# Patient Record
Sex: Female | Born: 1980
Health system: Southern US, Community
[De-identification: ages and names within clinical notes are randomized; demographics above are authoritative.]

## PROBLEM LIST (undated history)

## (undated) DIAGNOSIS — R87820 Cervical low risk human papillomavirus (HPV) DNA test positive: Secondary | ICD-10-CM

## (undated) DIAGNOSIS — F419 Anxiety disorder, unspecified: Secondary | ICD-10-CM

## (undated) DIAGNOSIS — E785 Hyperlipidemia, unspecified: Secondary | ICD-10-CM

## (undated) DIAGNOSIS — E781 Pure hyperglyceridemia: Secondary | ICD-10-CM

## (undated) DIAGNOSIS — F329 Major depressive disorder, single episode, unspecified: Secondary | ICD-10-CM

## (undated) DIAGNOSIS — T7840XA Allergy, unspecified, initial encounter: Secondary | ICD-10-CM

## (undated) DIAGNOSIS — K859 Acute pancreatitis without necrosis or infection, unspecified: Secondary | ICD-10-CM

## (undated) DIAGNOSIS — F32A Depression, unspecified: Secondary | ICD-10-CM

## (undated) HISTORY — PX: TONSILLECTOMY: SUR1361

## (undated) HISTORY — DX: Cervical low risk human papillomavirus (HPV) DNA test positive: R87.820

## (undated) HISTORY — DX: Depression, unspecified: F32.A

## (undated) HISTORY — DX: Allergy, unspecified, initial encounter: T78.40XA

## (undated) HISTORY — DX: Hyperlipidemia, unspecified: E78.5

## (undated) HISTORY — DX: Acute pancreatitis without necrosis or infection, unspecified: K85.90

## (undated) HISTORY — PX: TUBAL LIGATION: SHX77

## (undated) HISTORY — DX: Pure hyperglyceridemia: E78.1

## (undated) HISTORY — DX: Anxiety disorder, unspecified: F41.9

## (undated) HISTORY — DX: Major depressive disorder, single episode, unspecified: F32.9

## (undated) HISTORY — PX: CHOLECYSTECTOMY: SHX55

---

## 2000-10-30 DIAGNOSIS — K859 Acute pancreatitis without necrosis or infection, unspecified: Secondary | ICD-10-CM

## 2000-10-30 HISTORY — DX: Acute pancreatitis without necrosis or infection, unspecified: K85.90

## 2015-05-21 HISTORY — PX: ESOPHAGOGASTRODUODENOSCOPY: SHX1529

## 2017-12-13 LAB — HM MAMMOGRAPHY

## 2018-04-29 ENCOUNTER — Ambulatory Visit (INDEPENDENT_AMBULATORY_CARE_PROVIDER_SITE_OTHER): Payer: No Typology Code available for payment source | Admitting: Physician Assistant

## 2018-04-29 ENCOUNTER — Encounter: Payer: Self-pay | Admitting: Physician Assistant

## 2018-04-29 VITALS — BP 116/81 | HR 66 | Resp 14 | Ht 59.0 in | Wt 145.0 lb

## 2018-04-29 DIAGNOSIS — E782 Mixed hyperlipidemia: Secondary | ICD-10-CM

## 2018-04-29 DIAGNOSIS — N920 Excessive and frequent menstruation with regular cycle: Secondary | ICD-10-CM | POA: Diagnosis not present

## 2018-04-29 DIAGNOSIS — E785 Hyperlipidemia, unspecified: Secondary | ICD-10-CM | POA: Diagnosis not present

## 2018-04-29 DIAGNOSIS — Z7689 Persons encountering health services in other specified circumstances: Secondary | ICD-10-CM

## 2018-04-29 DIAGNOSIS — I499 Cardiac arrhythmia, unspecified: Secondary | ICD-10-CM | POA: Insufficient documentation

## 2018-04-29 DIAGNOSIS — Z5181 Encounter for therapeutic drug level monitoring: Secondary | ICD-10-CM

## 2018-04-29 DIAGNOSIS — F418 Other specified anxiety disorders: Secondary | ICD-10-CM | POA: Diagnosis not present

## 2018-04-29 DIAGNOSIS — Z6829 Body mass index (BMI) 29.0-29.9, adult: Secondary | ICD-10-CM

## 2018-04-29 DIAGNOSIS — F419 Anxiety disorder, unspecified: Secondary | ICD-10-CM | POA: Insufficient documentation

## 2018-04-29 DIAGNOSIS — E781 Pure hyperglyceridemia: Secondary | ICD-10-CM

## 2018-04-29 HISTORY — DX: Pure hyperglyceridemia: E78.1

## 2018-04-29 MED ORDER — BUPROPION HCL 75 MG PO TABS: ORAL_TABLET | ORAL | 1 refills | Status: DC

## 2018-04-29 MED ORDER — KELNOR 1/50 1-50 MG-MCG PO TABS: 1.0000 | ORAL_TABLET | Freq: Every day | ORAL | 4 refills | Status: DC

## 2018-04-29 MED ORDER — ESCITALOPRAM OXALATE 10 MG PO TABS: 10.0000 mg | ORAL_TABLET | Freq: Every day | ORAL | 1 refills | Status: DC

## 2018-04-29 NOTE — Progress Notes (Signed)
HPI:                                                                Virginia Martinez is a 37 y.o. female who presents to [place]: [place] today to establish care  Current concerns: medication management  Hypertriglyceridemia: currently on Zetia 10 mg. Reports at one point TG's were over 1000 and she was subsequently started on medication. Reports strong family history of hyperlipidemia. She has a history of acute pancreatitis in 2002 when she was 4 months post-partum. Reports about a week ago she was having some upper abdominal pain worse with eating anything. She went on a clear liquid diet for 2 days and symptoms resolved.  Depression/Anxiety: currently taking Lexapro 10 mg without difficulty. Denies depressed mod or anhedonia. Symptoms have been well-controlled for over 1 year. She is interested in Wellbutrin to help with appetite control and weight loss. Denies symptoms of mania/hypomania. Denies suicidal thinking. Denies auditory/visual hallucinations.  Menorrhagia: currently taking OCP for heavy menstrual cycles. Menses are regular, approx every 28 days. No cramping or pelvic pain. She has a history of tubal ligation. Denies any weakness or fatigue. She does have intermittent palpitations and chest pain.  Depression screen [facility] 2/9 04/29/2018  Decreased Interest 0  Down, Depressed, Hopeless 0  PHQ - 2 Score 0  Altered sleeping 0  Tired, decreased energy 0  Change in appetite 3  Feeling bad or failure about yourself  0  Trouble concentrating 0  Moving slowly or fidgety/restless 0  Suicidal thoughts 0  PHQ-9 Score 3    No flowsheet data found.    Past Medical History:  Diagnosis Date  . Anxiety   . Depression   . Hyperlipidemia   . Pancreatitis 2002   Past Surgical History:  Procedure Laterality Date  . CESAREAN SECTION    . CHOLECYSTECTOMY    . TONSILLECTOMY    . TUBAL LIGATION     Social History   Tobacco Use  .  Smoking status: Never Smoker  . Smokeless tobacco: Never Used  Substance Use Topics  . Alcohol use: Not Currently   family history includes Brain cancer in her father; Breast cancer in her mother; Hypertension in her mother; Prostate cancer in her father; Skin cancer in her mother.    ROS: negative except as noted in the HPI  Medications: Current Outpatient Medications  Medication Sig Dispense Refill  . buPROPion (WELLBUTRIN) 75 MG tablet Take 1 tablet (75 mg total) by mouth every morning for 7 days, THEN 2 tablets (150 mg total) every morning for 23 days. 60 tablet 1  . escitalopram (LEXAPRO) 10 MG tablet Take 1 tablet (10 mg total) by mouth at bedtime. 90 tablet 1  . ezetimibe (ZETIA) 10 MG tablet TK 1 T PO QD  3  . KELNOR 1/50 1-50 MG-MCG tablet Take 1 tablet by mouth daily. 3 Package 4   No current facility-administered medications for this visit.    Allergies  Allergen Reactions  . Azithromycin     Rash       Objective:  BP 116/81   Pulse 66   Resp 14   Ht 4\' 11"  (1.499 m)   Wt 145 lb (65.8 kg)   LMP 04/09/2018 (  Exact Date)   BMI 29.29 kg/m  Gen:  alert, not ill-appearing, no distress, appropriate for age 27: head normocephalic without obvious abnormality, conjunctiva and cornea clear, trachea midline Pulm: Normal work of breathing, normal phonation Neuro: alert and oriented x 3, no tremor MSK: extremities atraumatic, normal gait and station Skin: intact, no rashes on exposed skin, no jaundice, no cyanosis Psych: well-groomed, cooperative, good eye contact, euthymic mood, affect mood-congruent, speech is articulate, and thought processes clear and goal-directed    Results for orders placed or performed in visit on 04/29/18 (from the past 72 hour(s))  CBC     Status: Abnormal   Collection Time: 04/29/18  2:53 PM  Result Value Ref Range   WBC 7.3 3.8 - 10.8 Thousand/uL   RBC 3.88 3.80 - 5.10 Million/uL   Hemoglobin 12.8 11.7 - 15.5 g/dL   HCT 35.3 35.0 -  45.0 %   MCV 91.0 80.0 - 100.0 fL   MCH 33.0 27.0 - 33.0 pg   MCHC 36.3 (H) 32.0 - 36.0 g/dL   RDW 12.8 11.0 - 15.0 %   Platelets 248 140 - 400 Thousand/uL   MPV 11.6 7.5 - 12.5 fL  Comprehensive metabolic panel     Status: Abnormal   Collection Time: 04/29/18  2:53 PM  Result Value Ref Range   Glucose, Bld 78 65 - 139 mg/dL    Comment: .        Non-fasting reference interval .    BUN 12 7 - 25 mg/dL   Creat 0.54 0.50 - 1.10 mg/dL   BUN/Creatinine Ratio NOT APPLICABLE 6 - 22 (calc)   Sodium 134 (L) 135 - 146 mmol/L   Potassium 4.3 3.5 - 5.3 mmol/L   Chloride 100 98 - 110 mmol/L   CO2 24 20 - 32 mmol/L   Calcium 9.1 8.6 - 10.2 mg/dL   Total Protein 7.3 6.1 - 8.1 g/dL   Albumin 4.3 3.6 - 5.1 g/dL   Globulin 3.0 1.9 - 3.7 g/dL (calc)   AG Ratio 1.4 1.0 - 2.5 (calc)   Total Bilirubin 0.3 0.2 - 1.2 mg/dL   Alkaline phosphatase (APISO) 52 33 - 115 U/L   AST 10 10 - 30 U/L   ALT 7 6 - 29 U/L  Lipid Panel w/reflex Direct LDL     Status: Abnormal   Collection Time: 04/29/18  2:53 PM  Result Value Ref Range   Cholesterol 428 (H) <200 mg/dL   HDL 42 (L) >50 mg/dL   Triglycerides 2,362 (H) <150 mg/dL    Comment: Verified by repeat analysis. Marland Kitchen    LDL Cholesterol (Calc)  mg/dL (calc)    Comment: . LDL cholesterol not calculated. Triglyceride levels greater than 400 mg/dL invalidate calculated LDL results. . Reference range: <100 . Desirable range <100 mg/dL for primary prevention;   <70 mg/dL for patients with CHD or diabetic patients  with > or = 2 CHD risk factors. Marland Kitchen LDL-C is now calculated using the Martin-Hopkins  calculation, which is a validated novel method providing  better accuracy than the Friedewald equation in the  estimation of LDL-C.  Cresenciano Genre et al. Annamaria Helling. 7616;073(71): 2061-2068  (http://education.[employer].com/faq/FAQ164)    Total CHOL/HDL Ratio 10.2 (H) <5.0 (calc)   Non-HDL Cholesterol (Calc) 386 (H) <130 mg/dL (calc)    Comment: Non-HDL level >  or = 220 is very high and may indicate  genetic familial hypercholesterolemia (FH). Clinical  assessment and measurement of blood lipid levels  should be considered for  all first-degree relatives  of patients with an FH diagnosis. . For patients with diabetes plus 1 major ASCVD risk  factor, treating to a non-HDL-C goal of <100 mg/dL  (LDL-C of <70 mg/dL) is considered a therapeutic  option.   SPECIMEN COMPROMISED     Status: None   Collection Time: 04/29/18  2:53 PM  Result Value Ref Range   Specimen Integrity      Comment: . Whole blood, unspun or partially spun gel barrier tube was received more than 6 hours since collection. A  false elevation of K, Phos and LD as well as a false  decrease in glucose may occur due to prolonged contact  with red cells. Marland Kitchen   DIRECT LDL     Status: None   Collection Time: 04/29/18  2:53 PM  Result Value Ref Range   Direct LDL CANCELED     Comment: Test not performed. Extremely high  Triglycerides values (>1293 mg/dL)  interfere with the dLDL assay.  Marland Kitchen Desirable range <100 mg/dL for primary prevention;   <70 mg/dL for patients with CHD or diabetic patients  with > or = 2 CHD risk factors. .  Result canceled by the ancillary.    No results found.    Assessment and Plan: 37 y.o. female with   Encounter to establish care  Menorrhagia with regular cycle - Plan: KELNOR 1/50 1-50 MG-MCG tablet  Mixed anxiety and depressive disorder - Plan: buPROPion (WELLBUTRIN) 75 MG tablet, escitalopram (LEXAPRO) 10 MG tablet  Dyslipidemia - Plan: Lipid Panel w/reflex Direct LDL  Hypertriglyceridemia - Plan: Lipid Panel w/reflex Direct LDL  Encounter for medication monitoring - Plan: CBC, Comprehensive metabolic panel  Adult BMI 29.0-29.9 kg/sq m   - Personally reviewed PMH, PSH, PFH, medications, allergies, HM - Age-appropriate cancer screening: Pap smear UTD per patient, requesting records from [facility], mammogram completed  12/13/17, reviewed in [place], negative - Influenza n/a - Tdap UTD per patient - PHQ2 negative  Menorrhagia - nonsmoker, no history of VTE, no migraines or HTN. Hx of tubal ligation and compliant with OCP, urine preg not needed. Refill of generic Kelnor sent to pharmacy  Mixed anxiety and depressive disorder PHQ2=0, PHQ9=3 In remission on Lexapro 10 mg. Amenable to adding on Wellbutrin for weight gain. Advised patient to monitor for worsening anxiety/irritability. Starting low-dose and will titrate to therapeutic dose based on tolerability  Hypertriglyceridemia - fasting lipid panel - holding Zetia refill pending labs. She may be a good candidate for Lovaza or Vascepa - mediterranean diet, regular aerobic exercise and weight loss  BMI 29 - starting wellbutrin 75 mg QAM - counseled on weight loss through decreased caloric intake and increased aerobic exericse  Patient education and anticipatory guidance given Patient agrees with treatment plan Follow-up as needed if symptoms worsen or fail to improve  Darlyne Russian PA-C

## 2018-04-29 NOTE — Patient Instructions (Signed)
Physical Activity Recommendations for modifying lipids and lowering blood pressure Engage in aerobic physical activity to reduce LDL-cholesterol, non-HDL-cholesterol, and blood pressure  Frequency: 3-4 sessions per week  Intensity: moderate to vigorous  Duration: 40 minutes on average  Physical Activity Recommendations for secondary prevention 1. Aerobic exercise  Frequency: 3-5 sessions per week  Intensity: 50-80% capacity  Duration: 20 - 60 minutes  Examples: walking, treadmill, cycling, rowing, stair climbing, and arm/leg ergometry  2. Resistance exercise  Frequency: 2-3 sessions per week  Intensity: 10-15 repetitions/set to moderate fatigue  Duration: 1-3 sets of 8-10 upper and lower body exercises  Examples: calisthenics, elastic bands, cuff/hand weights, dumbbels, free weights, wall pulleys, and weight machines  Heart-Healthy Lifestyle  Eating a diet rich in vegetables, fruits and whole grains: also includes low-fat dairy products, poultry, fish, legumes, and nuts; limit intake of sweets, sugar-sweetened beverages and red meats  Getting regular exercise  Maintaining a healthy weight  Not smoking or getting help quitting  Staying on top of your health; for some people, lifestyle changes alone may not be enough to prevent a heart attack or stroke. In these cases, taking a statin at the right dose will most likely be necessary  

## 2018-04-30 ENCOUNTER — Encounter: Payer: Self-pay | Admitting: Physician Assistant

## 2018-04-30 DIAGNOSIS — Z6829 Body mass index (BMI) 29.0-29.9, adult: Secondary | ICD-10-CM | POA: Insufficient documentation

## 2018-04-30 DIAGNOSIS — E782 Mixed hyperlipidemia: Secondary | ICD-10-CM | POA: Insufficient documentation

## 2018-04-30 LAB — SPECIMEN COMPROMISED

## 2018-04-30 LAB — LIPID PANEL W/REFLEX DIRECT LDL
Cholesterol: 428 mg/dL — ABNORMAL HIGH (ref <200)
HDL: 42 mg/dL — AB (ref >50)
NON-HDL CHOLESTEROL (CALC): 386 mg/dL — AB (ref <130)
TRIGLYCERIDES: 2362 mg/dL — AB (ref <150)
Total CHOL/HDL Ratio: 10.2 (calc) — ABNORMAL HIGH (ref <5.0)

## 2018-04-30 LAB — COMPREHENSIVE METABOLIC PANEL
AG Ratio: 1.4 (calc) (ref 1.0–2.5)
ALT: 7 U/L (ref 6–29)
AST: 10 U/L (ref 10–30)
Albumin: 4.3 g/dL (ref 3.6–5.1)
Alkaline phosphatase (APISO): 52 U/L (ref 33–115)
BUN: 12 mg/dL (ref 7–25)
CO2: 24 mmol/L (ref 20–32)
CREATININE: 0.54 mg/dL (ref 0.50–1.10)
Calcium: 9.1 mg/dL (ref 8.6–10.2)
Chloride: 100 mmol/L (ref 98–110)
Globulin: 3 g/dL (calc) (ref 1.9–3.7)
Glucose, Bld: 78 mg/dL (ref 65–139)
Potassium: 4.3 mmol/L (ref 3.5–5.3)
Sodium: 134 mmol/L — ABNORMAL LOW (ref 135–146)
Total Bilirubin: 0.3 mg/dL (ref 0.2–1.2)
Total Protein: 7.3 g/dL (ref 6.1–8.1)

## 2018-04-30 LAB — CBC
HEMATOCRIT: 35.3 % (ref 35.0–45.0)
Hemoglobin: 12.8 g/dL (ref 11.7–15.5)
MCH: 33 pg (ref 27.0–33.0)
MCHC: 36.3 g/dL — AB (ref 32.0–36.0)
MCV: 91 fL (ref 80.0–100.0)
MPV: 11.6 fL (ref 7.5–12.5)
Platelets: 248 10*3/uL (ref 140–400)
RBC: 3.88 10*6/uL (ref 3.80–5.10)
RDW: 12.8 % (ref 11.0–15.0)
WBC: 7.3 10*3/uL (ref 3.8–10.8)

## 2018-04-30 LAB — DIRECT LDL

## 2018-04-30 MED ORDER — ICOSAPENT ETHYL 1 G PO CAPS: 2.0000 g | ORAL_CAPSULE | Freq: Two times a day (BID) | ORAL | 3 refills | Status: DC

## 2018-04-30 MED ORDER — ATORVASTATIN CALCIUM 40 MG PO TABS: 40.0000 mg | ORAL_TABLET | Freq: Every day | ORAL | 3 refills | Status: DC

## 2018-04-30 NOTE — Addendum Note (Signed)
Addended by: Nelson Chimes E on: 04/30/2018 01:43 PM   Modules accepted: Orders

## 2018-04-30 NOTE — Progress Notes (Signed)
Hi Virginia Martinez,  Your triglycerides are definitely high enough to get Vascepa approved. Your total cholesterol is also very high at 428. So I will also start you on Atorvastatin 40 mg at bedtime.  Let's recheck fasting lipid panel in 6-8 weeks.  FYI, your Vascepa may require a prior authorization so give [place] 2 weeks to get this approved. Here is a link to the prescription savings card [email or web address]  Best, [employer]

## 2018-05-01 ENCOUNTER — Encounter: Payer: Self-pay | Admitting: Physician Assistant

## 2018-05-06 ENCOUNTER — Other Ambulatory Visit (HOSPITAL_COMMUNITY)
Admission: RE | Admit: 2018-05-06 | Discharge: 2018-05-06 | Disposition: A | Discharge status: Home / Self Care | Payer: No Typology Code available for payment source | Source: Ambulatory Visit | Attending: Physician Assistant | Admitting: Physician Assistant

## 2018-05-06 ENCOUNTER — Encounter: Payer: Self-pay | Admitting: Physician Assistant

## 2018-05-06 ENCOUNTER — Ambulatory Visit (INDEPENDENT_AMBULATORY_CARE_PROVIDER_SITE_OTHER): Payer: No Typology Code available for payment source | Admitting: Physician Assistant

## 2018-05-06 VITALS — BP 114/78 | HR 77 | Wt 142.0 lb

## 2018-05-06 DIAGNOSIS — Z124 Encounter for screening for malignant neoplasm of cervix: Secondary | ICD-10-CM

## 2018-05-06 DIAGNOSIS — Z808 Family history of malignant neoplasm of other organs or systems: Secondary | ICD-10-CM

## 2018-05-06 DIAGNOSIS — Z0000 Encounter for general adult medical examination without abnormal findings: Secondary | ICD-10-CM

## 2018-05-06 DIAGNOSIS — Z1283 Encounter for screening for malignant neoplasm of skin: Secondary | ICD-10-CM

## 2018-05-06 DIAGNOSIS — E782 Mixed hyperlipidemia: Secondary | ICD-10-CM | POA: Diagnosis not present

## 2018-05-06 DIAGNOSIS — B3731 Acute candidiasis of vulva and vagina: Secondary | ICD-10-CM

## 2018-05-06 DIAGNOSIS — B373 Candidiasis of vulva and vagina: Secondary | ICD-10-CM

## 2018-05-06 NOTE — Progress Notes (Signed)
HPI:                                                                Virginia Martinez is a 37 y.o. female who presents to [place]: [place] today for annual physical exam w/Pap smear  No concerns No history of abnormal Pap smears Denies breast tenderness, palpable masses or nipple discharge. She is currently undergoing early breast cancer screening due to fibrocystic disease. Denies abnormal uterine bleeding, vaginal discharge or dyspareunia History of tubal ligation Takes OCP for menorrhagia. Last Hgb 12.8, 1 week ago  Depression screen [facility] 2/9 04/29/2018  Decreased Interest 0  Down, Depressed, Hopeless 0  PHQ - 2 Score 0  Altered sleeping 0  Tired, decreased energy 0  Change in appetite 3  Feeling bad or failure about yourself  0  Trouble concentrating 0  Moving slowly or fidgety/restless 0  Suicidal thoughts 0  PHQ-9 Score 3    No flowsheet data found.    Past Medical History:  Diagnosis Date  . Anxiety   . Depression   . Hyperlipidemia   . Hypertriglyceridemia 04/29/2018  . Pancreatitis 2002   Past Surgical History:  Procedure Laterality Date  . CESAREAN SECTION    . CHOLECYSTECTOMY    . TONSILLECTOMY    . TUBAL LIGATION     Social History   Tobacco Use  . Smoking status: Never Smoker  . Smokeless tobacco: Never Used  Substance Use Topics  . Alcohol use: Not Currently   family history includes Brain cancer in her father; Breast cancer in her mother; Hypertension in her mother; Prostate cancer in her father; Skin cancer in her mother.    ROS: negative except as noted in the HPI  Medications: Current Outpatient Medications  Medication Sig Dispense Refill  . atorvastatin (LIPITOR) 40 MG tablet Take 1 tablet (40 mg total) by mouth at bedtime. 30 tablet 3  . buPROPion (WELLBUTRIN) 75 MG tablet Take 1 tablet (75 mg total) by mouth every morning for 7 days, THEN 2 tablets (150 mg total) every morning for 23 days.  60 tablet 1  . escitalopram (LEXAPRO) 10 MG tablet Take 1 tablet (10 mg total) by mouth at bedtime. 90 tablet 1  . ezetimibe (ZETIA) 10 MG tablet TK 1 T PO QD  3  . Icosapent Ethyl 1 g CAPS Take 2 capsules (2 g total) by mouth 2 (two) times daily with a meal. 120 capsule 3  . KELNOR 1/50 1-50 MG-MCG tablet Take 1 tablet by mouth daily. 3 Package 4   No current facility-administered medications for this visit.    Allergies  Allergen Reactions  . Azithromycin     Rash       Objective:  BP 114/78   Pulse 77   Wt 142 lb (64.4 kg)   LMP 04/09/2018 (Exact Date)   BMI 28.68 kg/m  General Appearance:  Alert, cooperative, no distress, appropriate for age                            Head:  Normocephalic, without obvious abnormality  Eyes:  PERRL, EOM's intact, conjunctiva and cornea clear                             Ears:  TM pearly gray color and semitransparent, external ear canals normal, both ears                            Nose:  Nares symmetrical                          Throat:  Lips, tongue, and mucosa are moist, pink, and intact; good dentition                             Neck:  Supple; symmetrical, trachea midline, no adenopathy; thyroid: no enlargement, symmetric, no tenderness/mass/nodules                             Back:  Symmetrical, no curvature, ROM normal               Chest/Breast:  Diffuse fibrocystic densities, No tenderness, or nipple abnormality. No dimpling or discharge                           Lungs:  Clear to auscultation bilaterally, respirations unlabored                             Heart:  regular rate & normal rhythm, S1 and S2 normal, no murmurs, rubs, or gallops                     Abdomen:  Soft, non-tender, no mass or organomegaly              Genitourinary:  vulva without rashes or lesions, normal introitus and urethral meatus, vaginal mucosa without erythema, normal discharge, cervix non-friable without lesions          Musculoskeletal:  Tone and strength strong and symmetrical, all extremities; no joint pain or edema, normal gait and station                                     Lymphatic:  No adenopathy             Skin/Hair/Nails:  Skin warm, dry and intact, no rashes or abnormal dyspigmentation on limited exam                   Neurologic:  Alert and oriented x3, no cranial nerve deficits, DTR's intact, sensation grossly intact, normal gait and station, no tremor Psych: well-groomed, cooperative, good eye contact, euthymic mood, affect mood-congruent, speech is articulate, and thought processes clear and goal-directed  A chaperone was used for the GU portion of the exam, Virginia Martinez, CMA.   No results found for this or any previous visit (from the past 72 hour(s)). No results found.    Assessment and Plan: 37 y.o. female with   Encounter for annual physical exam  Encounter for Pap smear of cervix with HPV DNA cotesting - Plan: Cytology - PAP  Mixed hypercholesterolemia and hypertriglyceridemia - Plan: Lipid Panel w/reflex Direct LDL   -  started on Vascepa and Atorvastatin last week - counseled on fat and cholesterol restricted Mediterranean diet - BP at goal <130/80 - counseled on regular aerobic exercise  Patient education and anticipatory guidance given Patient agrees with treatment plan Follow-up in 6 weeks for fasting lipids as needed if symptoms worsen or fail to improve  Darlyne Russian PA-C

## 2018-05-06 NOTE — Addendum Note (Signed)
Addended by: Nelson Chimes E on: 05/06/2018 11:02 AM   Modules accepted: Orders

## 2018-05-06 NOTE — Patient Instructions (Signed)
Fat and Cholesterol Restricted Diet High levels of fat and cholesterol in your blood may lead to various health problems, such as diseases of the heart, blood vessels, gallbladder, liver, and pancreas. Fats are concentrated sources of energy that come in various forms. Certain types of fat, including saturated fat, may be harmful in excess. Cholesterol is a substance needed by your body in small amounts. Your body makes all the cholesterol it needs. Excess cholesterol comes from the food you eat. When you have high levels of cholesterol and saturated fat in your blood, health problems can develop because the excess fat and cholesterol will gather along the walls of your blood vessels, causing them to narrow. Choosing the right foods will help you control your intake of fat and cholesterol. This will help keep the levels of these substances in your blood within normal limits and reduce your risk of disease. What is my plan? Your health care provider recommends that you:  Limit your fat intake to ______% or less of your total calories per day.  Limit the amount of cholesterol in your diet to less than _________mg per day.  Eat 20-30 grams of fiber each day.  What types of fat should I choose?  Choose healthy fats more often. Choose monounsaturated and polyunsaturated fats, such as olive and canola oil, flaxseeds, walnuts, almonds, and seeds.  Eat more omega-3 fats. Good choices include salmon, mackerel, sardines, tuna, flaxseed oil, and ground flaxseeds. Aim to eat fish at least two times a week.  Limit saturated fats. Saturated fats are primarily found in animal products, such as meats, butter, and cream. Plant sources of saturated fats include palm oil, palm kernel oil, and coconut oil.  Avoid foods with partially hydrogenated oils in them. These contain trans fats. Examples of foods that contain trans fats are stick margarine, some tub margarines, cookies, crackers, and other baked goods. What  general guidelines do I need to follow? These guidelines for healthy eating will help you control your intake of fat and cholesterol:  Check food labels carefully to identify foods with trans fats or high amounts of saturated fat.  Fill one half of your plate with vegetables and green salads.  Fill one fourth of your plate with whole grains. Look for the word "whole" as the first word in the ingredient list.  Fill one fourth of your plate with lean protein foods.  Limit fruit to two servings a day. Choose fruit instead of juice.  Eat more foods that contain fiber, such as apples, broccoli, carrots, beans, peas, and barley.  Eat more home-cooked food and less restaurant, buffet, and fast food.  Limit or avoid alcohol.  Limit foods high in starch and sugar.  Limit fried foods.  Cook foods using methods other than frying. Baking, boiling, grilling, and broiling are all great options.  Lose weight if you are overweight. Losing just 5-10% of your initial body weight can help your overall health and prevent diseases such as diabetes and heart disease.  What foods can I eat? Grains  Whole grains, such as whole wheat or whole grain breads, crackers, cereals, and pasta. Unsweetened oatmeal, bulgur, barley, quinoa, or brown rice. Corn or whole wheat flour tortillas. Vegetables  Fresh or frozen vegetables (raw, steamed, roasted, or grilled). Green salads. Fruits  All fresh, canned (in natural juice), or frozen fruits. Meats and other protein foods  Ground beef (85% or leaner), grass-fed beef, or beef trimmed of fat. Skinless chicken or turkey. Ground chicken or turkey.   Pork trimmed of fat. All fish and seafood. Eggs. Dried beans, peas, or lentils. Unsalted nuts or seeds. Unsalted canned or dry beans. Dairy  Low-fat dairy products, such as skim or 1% milk, 2% or reduced-fat cheeses, low-fat ricotta or cottage cheese, or plain low-fat yo Fats and oils  Tub margarines without trans  fats. Light or reduced-fat mayonnaise and salad dressings. Avocado. Olive, canola, sesame, or safflower oils. Natural peanut or almond butter (choose ones without added sugar and oil). The items listed above may not be a complete list of recommended foods or beverages. Contact your dietitian for more options. Foods to avoid Grains  White bread. White pasta. White rice. Cornbread. Bagels, pastries, and croissants. Crackers that contain trans fat. Vegetables  White potatoes. Corn. Creamed or fried vegetables. Vegetables in a cheese sauce. Fruits  Dried fruits. Canned fruit in light or heavy syrup. Fruit juice. Meats and other protein foods  Fatty cuts of meat. Ribs, chicken wings, bacon, sausage, bologna, salami, chitterlings, fatback, hot dogs, bratwurst, and packaged luncheon meats. Liver and organ meats. Dairy  Whole or 2% milk, cream, half-and-half, and cream cheese. Whole milk cheeses. Whole-fat or sweetened yogurt. Full-fat cheeses. Nondairy creamers and whipped toppings. Processed cheese, cheese spreads, or cheese curds. Beverages  Alcohol. Sweetened drinks (such as sodas, lemonade, and fruit drinks or punches). Fats and oils  Butter, stick margarine, lard, shortening, ghee, or bacon fat. Coconut, palm kernel, or palm oils. Sweets and desserts  Corn syrup, sugars, honey, and molasses. Candy. Jam and jelly. Syrup. Sweetened cereals. Cookies, pies, cakes, donuts, muffins, and ice cream. The items listed above may not be a complete list of foods and beverages to avoid. Contact your dietitian for more information. This information is not intended to replace advice given to you by your health care provider. Make sure you discuss any questions you have with your health care provider. Document Released: 10/16/2005 Document Revised: 11/06/2014 Document Reviewed: 01/14/2014 Elsevier Interactive Patient Education  2018 [employer]  

## 2018-05-08 ENCOUNTER — Encounter: Payer: Self-pay | Admitting: Physician Assistant

## 2018-05-08 DIAGNOSIS — R87820 Cervical low risk human papillomavirus (HPV) DNA test positive: Secondary | ICD-10-CM

## 2018-05-08 HISTORY — DX: Cervical low risk human papillomavirus (HPV) DNA test positive: R87.820

## 2018-05-08 LAB — CYTOLOGY - PAP
Adequacy: ABSENT
DIAGNOSIS: NEGATIVE
HPV 16/18/45 GENOTYPING: NEGATIVE
HPV: DETECTED — AB

## 2018-05-08 NOTE — Progress Notes (Signed)
Pap smear did not show any cell abnormalities HPV testing was positive, but the high risk strain testing was negative Recommend we repeat your Pap smear in 1 year

## 2018-05-09 MED ORDER — FLUCONAZOLE 150 MG PO TABS: 150.0000 mg | ORAL_TABLET | Freq: Once | ORAL | 0 refills | Status: AC

## 2018-05-09 NOTE — Addendum Note (Signed)
Addended by: Nelson Chimes E on: 05/09/2018 04:50 PM   Modules accepted: Orders

## 2018-06-19 ENCOUNTER — Other Ambulatory Visit
Admission: RE | Admit: 2018-06-19 | Discharge: 2018-06-19 | Disposition: A | Discharge status: Home / Self Care | Payer: No Typology Code available for payment source | Source: Ambulatory Visit | Attending: Physician Assistant | Admitting: Physician Assistant

## 2018-06-19 DIAGNOSIS — E785 Hyperlipidemia, unspecified: Secondary | ICD-10-CM | POA: Insufficient documentation

## 2018-06-19 DIAGNOSIS — E781 Pure hyperglyceridemia: Secondary | ICD-10-CM | POA: Insufficient documentation

## 2018-06-19 DIAGNOSIS — E782 Mixed hyperlipidemia: Secondary | ICD-10-CM

## 2018-06-19 LAB — LIPID PANEL
Cholesterol: 171 mg/dL (ref 0–200)
HDL: 50 mg/dL (ref >40)
LDL CALC: 75 mg/dL (ref 0–99)
TRIGLYCERIDES: 230 mg/dL — AB (ref <150)
Total CHOL/HDL Ratio: 3.4 RATIO
VLDL: 46 mg/dL — ABNORMAL HIGH (ref 0–40)

## 2018-07-02 ENCOUNTER — Other Ambulatory Visit: Payer: Self-pay | Admitting: Physician Assistant

## 2018-07-02 DIAGNOSIS — F418 Other specified anxiety disorders: Secondary | ICD-10-CM

## 2018-07-30 ENCOUNTER — Encounter: Payer: Self-pay | Admitting: Physician Assistant

## 2018-09-03 ENCOUNTER — Other Ambulatory Visit: Payer: Self-pay | Admitting: Physician Assistant

## 2018-09-03 DIAGNOSIS — E782 Mixed hyperlipidemia: Secondary | ICD-10-CM

## 2018-09-30 ENCOUNTER — Other Ambulatory Visit: Payer: Self-pay | Admitting: Physician Assistant

## 2018-09-30 DIAGNOSIS — F418 Other specified anxiety disorders: Secondary | ICD-10-CM

## 2018-11-19 ENCOUNTER — Other Ambulatory Visit: Payer: Self-pay | Admitting: Physician Assistant

## 2018-11-19 DIAGNOSIS — F418 Other specified anxiety disorders: Secondary | ICD-10-CM

## 2018-12-06 DIAGNOSIS — N8000 Endometriosis of the uterus, unspecified: Secondary | ICD-10-CM | POA: Insufficient documentation

## 2018-12-06 LAB — HM PAP SMEAR: HM Pap smear: NEGATIVE

## 2018-12-19 ENCOUNTER — Other Ambulatory Visit: Payer: Self-pay | Admitting: Physician Assistant

## 2018-12-19 DIAGNOSIS — F418 Other specified anxiety disorders: Secondary | ICD-10-CM

## 2018-12-23 ENCOUNTER — Other Ambulatory Visit: Payer: Self-pay

## 2018-12-23 DIAGNOSIS — F418 Other specified anxiety disorders: Secondary | ICD-10-CM

## 2018-12-23 MED ORDER — ESCITALOPRAM OXALATE 10 MG PO TABS: 10.0000 mg | ORAL_TABLET | Freq: Every day | ORAL | 0 refills | Status: DC

## 2018-12-23 NOTE — Telephone Encounter (Signed)
Patient scheduled a follow-up appointment.

## 2019-01-06 ENCOUNTER — Ambulatory Visit: Payer: No Typology Code available for payment source | Admitting: Physician Assistant

## 2019-01-16 ENCOUNTER — Encounter: Payer: Self-pay | Admitting: Physician Assistant

## 2019-01-20 ENCOUNTER — Encounter: Payer: Self-pay | Admitting: Physician Assistant

## 2019-01-20 ENCOUNTER — Ambulatory Visit (INDEPENDENT_AMBULATORY_CARE_PROVIDER_SITE_OTHER): Payer: No Typology Code available for payment source | Admitting: Physician Assistant

## 2019-01-20 ENCOUNTER — Other Ambulatory Visit: Payer: Self-pay

## 2019-01-20 VITALS — BP 106/73 | HR 60 | Wt 134.0 lb

## 2019-01-20 DIAGNOSIS — R635 Abnormal weight gain: Secondary | ICD-10-CM

## 2019-01-20 DIAGNOSIS — D500 Iron deficiency anemia secondary to blood loss (chronic): Secondary | ICD-10-CM | POA: Insufficient documentation

## 2019-01-20 DIAGNOSIS — F419 Anxiety disorder, unspecified: Secondary | ICD-10-CM

## 2019-01-20 DIAGNOSIS — Z1231 Encounter for screening mammogram for malignant neoplasm of breast: Secondary | ICD-10-CM

## 2019-01-20 DIAGNOSIS — E782 Mixed hyperlipidemia: Secondary | ICD-10-CM

## 2019-01-20 DIAGNOSIS — Z5181 Encounter for therapeutic drug level monitoring: Secondary | ICD-10-CM | POA: Diagnosis not present

## 2019-01-20 DIAGNOSIS — F418 Other specified anxiety disorders: Secondary | ICD-10-CM

## 2019-01-20 MED ORDER — ESCITALOPRAM OXALATE 10 MG PO TABS: 10.0000 mg | ORAL_TABLET | Freq: Every day | ORAL | 3 refills | Status: DC

## 2019-01-20 MED ORDER — BUPROPION HCL 100 MG PO TABS: 100.0000 mg | ORAL_TABLET | Freq: Every day | ORAL | 1 refills | Status: DC

## 2019-01-20 MED ORDER — ATORVASTATIN CALCIUM 40 MG PO TABS: 40.0000 mg | ORAL_TABLET | Freq: Every day | ORAL | 3 refills | Status: DC

## 2019-01-20 MED ORDER — ICOSAPENT ETHYL 1 G PO CAPS: 2.0000 g | ORAL_CAPSULE | Freq: Two times a day (BID) | ORAL | 3 refills | Status: DC

## 2019-01-20 NOTE — Progress Notes (Signed)
HPI:                                                                Virginia Martinez is a 38 y.o. female who presents to [place]: [place] today for medication management  Hypertriglyceridemia/HLD: currently on Vascepa and Atorvastatin. Reports strong family history of hyperlipidemia. She has a history of acute pancreatitis in 2002 when she was 4 months post-partum.   Depression/Anxiety: currently taking Lexapro 10 mg without difficulty. Denies depressed mod or anhedonia. Symptoms have been well-controlled for over 1 year. She is interested in Wellbutrin to help with appetite control and weight loss. Denies symptoms of mania/hypomania. Denies suicidal thinking. Denies auditory/visual hallucinations.  Weight gain: feels like the Wellbutrin is no longer helping with appetite control. Reports losing approx 10 pounds and gaining back about 5 lb.   Menorrhagia: planning to have IUD inserted in 1 week. CBC showed mild anemia (Hgb 11.9). Pap smear was repeated 11/2018 and negative.  Depression screen [facility] 2/9 01/20/2019 04/29/2018  Decreased Interest 0 0  Down, Depressed, Hopeless 0 0  PHQ - 2 Score 0 0  Altered sleeping 0 0  Tired, decreased energy 1 0  Change in appetite 0 3  Feeling bad or failure about yourself  0 0  Trouble concentrating 0 0  Moving slowly or fidgety/restless 0 0  Suicidal thoughts 0 0  PHQ-9 Score 1 3   No flowsheet data found.    Past Medical History:  Diagnosis Date  . Anxiety   . Cervical low risk human papillomavirus (HPV) DNA test positive 05/08/2018  . Depression   . Hyperlipidemia   . Hypertriglyceridemia 04/29/2018  . Pancreatitis 2002   Past Surgical History:  Procedure Laterality Date  . CESAREAN SECTION    . CHOLECYSTECTOMY    . TONSILLECTOMY    . TUBAL LIGATION     Social History   Tobacco Use  . Smoking status: Never Smoker  . Smokeless tobacco: Never Used  Substance Use Topics  . Alcohol use:  Not Currently   family history includes Brain cancer in her father; Breast cancer in her mother; Hypertension in her mother; Prostate cancer in her father; Skin cancer in her mother.    ROS: negative except as noted in the HPI  Medications: Current Outpatient Medications  Medication Sig Dispense Refill  . atorvastatin (LIPITOR) 40 MG tablet Take 1 tablet (40 mg total) by mouth at bedtime. 90 tablet 3  . buPROPion (WELLBUTRIN) 100 MG tablet Take 1 tablet (100 mg total) by mouth daily. 90 tablet 1  . Icosapent Ethyl 1 g CAPS Take 2 capsules (2 g total) by mouth 2 (two) times daily with a meal. 360 capsule 3  . KELNOR 1/50 1-50 MG-MCG tablet Take 1 tablet by mouth daily. 3 Package 4  . Lactobacillus (PROBIOTIC ACIDOPHILUS PO) Take by mouth.    . escitalopram (LEXAPRO) 10 MG tablet Take 1 tablet (10 mg total) by mouth at bedtime. 90 tablet 3   No current facility-administered medications for this visit.    Allergies  Allergen Reactions  . Azithromycin Hives    Rash       Objective:  BP 106/73   Pulse 60   Wt 134 lb (60.8 kg)  BMI 27.06 kg/m  Gen:  alert, not ill-appearing, no distress, appropriate for age 60: head normocephalic without obvious abnormality, conjunctiva and cornea clear, trachea midline Pulm: Normal work of breathing, normal phonation  Neuro: alert and oriented x 3, no tremor MSK: extremities atraumatic, normal gait and station Skin: intact, no rashes on exposed skin, no jaundice, no cyanosis Psych: well-groomed, cooperative, good eye contact, euthymic mood, affect mood-congruent, speech is articulate, and thought processes clear and goal-directed  Wt Readings from Last 3 Encounters:  01/20/19 134 lb (60.8 kg)  05/06/18 142 lb (64.4 kg)  04/29/18 145 lb (65.8 kg)    Lab Results  Component Value Date   CREATININE 0.54 04/29/2018   BUN 12 04/29/2018   NA 134 (L) 04/29/2018   K 4.3 04/29/2018   CL 100 04/29/2018   CO2 24 04/29/2018   Lab Results   Component Value Date   ALT 7 04/29/2018   AST 10 04/29/2018   BILITOT 0.3 04/29/2018   Lab Results  Component Value Date   CHOL 171 06/19/2018   HDL 50 06/19/2018   LDLCALC 75 06/19/2018   LDLDIRECT CANCELED 04/29/2018   TRIG 230 (H) 06/19/2018   CHOLHDL 3.4 06/19/2018       Assessment and Plan: 38 y.o. female with   .Rickell was seen today for medication management.  Diagnoses and all orders for this visit:  Encounter for medication monitoring  Mixed hypercholesterolemia and hypertriglyceridemia -     atorvastatin (LIPITOR) 40 MG tablet; Take 1 tablet (40 mg total) by mouth at bedtime. -     Icosapent Ethyl 1 g CAPS; Take 2 capsules (2 g total) by mouth 2 (two) times daily with a meal.  Anxiety  Mixed anxiety and depressive disorder -     escitalopram (LEXAPRO) 10 MG tablet; Take 1 tablet (10 mg total) by mouth at bedtime. -     buPROPion (WELLBUTRIN) 100 MG tablet; Take 1 tablet (100 mg total) by mouth daily.  Iron deficiency anemia due to chronic blood loss  Breast cancer screening by mammogram -     MM 3D SCREEN BREAST BILATERAL; Future  Abnormal weight gain -     buPROPion (WELLBUTRIN) 100 MG tablet; Take 1 tablet (100 mg total) by mouth daily.  Weight gain - overall down 11 pounds - increasing wellbutrin from 75 mg to 100 mg - recommended lifestyle changes and [employer] app   Patient education and anticipatory guidance given Patient agrees with treatment plan Follow-up in 4 months for fasting CPE or sooner as needed if symptoms worsen or fail to improve  Darlyne Russian PA-C

## 2019-01-20 NOTE — Patient Instructions (Signed)
Iron Deficiency Anemia, Adult  Iron deficiency anemia is a condition in which the concentration of red blood cells or hemoglobin in the blood is below normal because of too little iron. Hemoglobin is a substance in red blood cells that carries oxygen to the body's tissues. When the concentration of red blood cells or hemoglobin is too low, not enough oxygen reaches these tissues.  Iron deficiency anemia is usually long-lasting (chronic) and it develops over time. It may or may not cause symptoms. It is a common type of anemia.  What are the causes?  This condition may be caused by:   Not enough iron in the diet.   Blood loss caused by bleeding in the intestine.   Blood loss from a gastrointestinal condition like Crohn disease.   Frequent blood draws, such as from blood donation.   Abnormal absorption in the gut.   Heavy menstrual periods in women.   Cancers of the gastrointestinal system, such as colon cancer.  What are the signs or symptoms?  Symptoms of this condition may include:   Fatigue.   Headache.   Pale skin, lips, and nail beds.   Poor appetite.   Weakness.   Shortness of breath.   Dizziness.   Cold hands and feet.   Fast or irregular heartbeat.   Irritability. This is more common in severe anemia.   Rapid breathing. This is more common in severe anemia.  Mild anemia may not cause any symptoms.  How is this diagnosed?  This condition is diagnosed based on:   Your medical history.   A physical exam.   Blood tests.  You may have additional tests to find the underlying cause of your anemia, such as:   Testing for blood in the stool (fecal occult blood test).   A procedure to see inside your colon and rectum (colonoscopy).   A procedure to see inside your esophagus and stomach (endoscopy).   A test in which cells are removed from bone marrow (bone marrow aspiration) or fluid is removed from the bone marrow to be examined (biopsy). This is rarely needed.  How is this treated?  This  condition is treated by correcting the cause of your iron deficiency. Treatment may involve:   Adding iron-rich foods to your diet.   Taking iron supplements. If you are pregnant or breastfeeding, you may need to take extra iron because your normal diet usually does not provide the amount of iron that you need.   Increasing vitamin C intake. Vitamin C helps your body absorb iron. Your health care provider may recommend that you take iron supplements along with a glass of orange juice or a vitamin C supplement.   Medicines to make heavy menstrual flow lighter.   Surgery.  You may need repeat blood tests to determine whether treatment is working. Depending on the underlying cause, the anemia should be corrected within 2 months of starting treatment. If the treatment does not seem to be working, you may need more testing.  Follow these instructions at home:  Medicines   Take over-the-counter and prescription medicines only as told by your health care provider. This includes iron supplements and vitamins.   If you cannot tolerate taking iron supplements by mouth, talk with your health care provider about taking them through a vein (intravenously) or an injection into a muscle.   For the best iron absorption, you should take iron supplements when your stomach is empty. If you cannot tolerate them on an empty stomach,   you may need to take them with food.   Do not drink milk or take antacids at the same time as your iron supplements. Milk and antacids may interfere with iron absorption.   Iron supplements can cause constipation. To prevent constipation, include fiber in your diet as told by your health care provider. A stool softener may also be recommended.  Eating and drinking     Talk with your health care provider before changing your diet. He or she may recommend that you eat foods that contain a lot of iron, such as:  ? Liver.  ? Low-fat (lean) beef.  ? Breads and cereals that have iron added to them (are  fortified).  ? Eggs.  ? Dried fruit.  ? Dark green, leafy vegetables.   To help your body use the iron from iron-rich foods, eat those foods at the same time as fresh fruits and vegetables that are high in vitamin C. Foods that are high in vitamin C include:  ? Oranges.  ? Peppers.  ? Tomatoes.  ? Mangoes.   Drinkenoughfluid to keep your urine clear or pale yellow.  General instructions   Return to your normal activities as told by your health care provider. Ask your health care provider what activities are safe for you.   Practice good hygiene. Anemia can make you more prone to illness and infection.   Keep all follow-up visits as told by your health care provider. This is important.  Contact a health care provider if:   You feel nauseous or you vomit.   You feel weak.   You have unexplained sweating.   You develop symptoms of constipation, such as:  ? Having fewer than three bowel movements a week.  ? Straining to have a bowel movement.  ? Having stools that are hard, dry, or larger than normal.  ? Feeling full or bloated.  ? Pain in the lower abdomen.  ? Not feeling relief after having a bowel movement.  Get help right away if:   You faint. If this happens, do not drive yourself to the hospital. Call your local emergency services (911 in the [place]   You have chest pain.   You have shortness of breath that:  ? Is severe.  ? Gets worse with physical activity.   You have a rapid heartbeat.   You become light-headed when getting up from a sitting or lying down position.  This information is not intended to replace advice given to you by your health care provider. Make sure you discuss any questions you have with your health care provider.  Document Released: 10/13/2000 Document Revised: 07/05/2016 Document Reviewed: 07/05/2016  Elsevier Interactive Patient Education  2019 [employer]

## 2019-01-21 MED ORDER — BUPROPION HCL ER (SR) 100 MG PO TB12: 100.0000 mg | ORAL_TABLET | Freq: Every day | ORAL | 1 refills | Status: DC

## 2019-01-24 ENCOUNTER — Encounter: Payer: Self-pay | Admitting: Physician Assistant

## 2019-01-24 NOTE — Progress Notes (Unsigned)
Negative for intraepithelial lesion or malignancy.  

## 2019-02-08 ENCOUNTER — Encounter: Payer: Self-pay | Admitting: Physician Assistant

## 2019-03-31 ENCOUNTER — Encounter: Payer: Self-pay | Admitting: Physician Assistant

## 2019-09-08 ENCOUNTER — Encounter: Payer: No Typology Code available for payment source | Admitting: Osteopathic Medicine

## 2019-11-03 ENCOUNTER — Encounter: Payer: No Typology Code available for payment source | Admitting: Osteopathic Medicine

## 2019-12-11 MED FILL — ATORVASTATIN 40 MG TABLET: 40 | 90 days supply | Qty: 90 | Fill #0

## 2020-01-28 ENCOUNTER — Other Ambulatory Visit: Payer: Self-pay | Admitting: Physician Assistant

## 2020-01-28 DIAGNOSIS — F418 Other specified anxiety disorders: Secondary | ICD-10-CM

## 2020-02-23 ENCOUNTER — Other Ambulatory Visit: Payer: Self-pay

## 2020-02-23 ENCOUNTER — Ambulatory Visit (INDEPENDENT_AMBULATORY_CARE_PROVIDER_SITE_OTHER): Payer: No Typology Code available for payment source | Admitting: Osteopathic Medicine

## 2020-02-23 ENCOUNTER — Encounter: Payer: Self-pay | Admitting: Osteopathic Medicine

## 2020-02-23 ENCOUNTER — Encounter: Payer: Self-pay | Admitting: Gastroenterology

## 2020-02-23 VITALS — BP 107/72 | HR 61 | Temp 98.3°F | Wt 148.1 lb

## 2020-02-23 DIAGNOSIS — E782 Mixed hyperlipidemia: Secondary | ICD-10-CM

## 2020-02-23 DIAGNOSIS — F418 Other specified anxiety disorders: Secondary | ICD-10-CM | POA: Diagnosis not present

## 2020-02-23 DIAGNOSIS — N920 Excessive and frequent menstruation with regular cycle: Secondary | ICD-10-CM

## 2020-02-23 DIAGNOSIS — Z0000 Encounter for general adult medical examination without abnormal findings: Secondary | ICD-10-CM | POA: Diagnosis not present

## 2020-02-23 DIAGNOSIS — Z1231 Encounter for screening mammogram for malignant neoplasm of breast: Secondary | ICD-10-CM

## 2020-02-23 DIAGNOSIS — K219 Gastro-esophageal reflux disease without esophagitis: Secondary | ICD-10-CM

## 2020-02-23 DIAGNOSIS — R1013 Epigastric pain: Secondary | ICD-10-CM

## 2020-02-23 LAB — CBC
HCT: 42.5 % (ref 35.0–45.0)
Hemoglobin: 14.1 g/dL (ref 11.7–15.5)
MCH: 31.5 pg (ref 27.0–33.0)
MCHC: 33.2 g/dL (ref 32.0–36.0)
MCV: 95.1 fL (ref 80.0–100.0)
MPV: 11.1 fL (ref 7.5–12.5)
Platelets: 234 10*3/uL (ref 140–400)
RBC: 4.47 10*6/uL (ref 3.80–5.10)
RDW: 12.1 % (ref 11.0–15.0)
WBC: 5.5 10*3/uL (ref 3.8–10.8)

## 2020-02-23 LAB — COMPLETE METABOLIC PANEL WITH GFR
AG Ratio: 1.6 (calc) (ref 1.0–2.5)
ALT: 40 U/L — ABNORMAL HIGH (ref 6–29)
AST: 25 U/L (ref 10–30)
Albumin: 4.7 g/dL (ref 3.6–5.1)
Alkaline phosphatase (APISO): 66 U/L (ref 31–125)
BUN: 15 mg/dL (ref 7–25)
CO2: 29 mmol/L (ref 20–32)
Calcium: 9.5 mg/dL (ref 8.6–10.2)
Chloride: 103 mmol/L (ref 98–110)
Creat: 0.55 mg/dL (ref 0.50–1.10)
GFR, Est African American: 136 mL/min/{1.73_m2} (ref >=60)
GFR, Est Non African American: 117 mL/min/{1.73_m2} (ref >=60)
Globulin: 3 g/dL (calc) (ref 1.9–3.7)
Glucose, Bld: 91 mg/dL (ref 65–99)
Potassium: 4.4 mmol/L (ref 3.5–5.3)
Sodium: 138 mmol/L (ref 135–146)
Total Bilirubin: 0.8 mg/dL (ref 0.2–1.2)
Total Protein: 7.7 g/dL (ref 6.1–8.1)

## 2020-02-23 LAB — LIPID PANEL
Cholesterol: 200 mg/dL — ABNORMAL HIGH (ref <200)
HDL: 49 mg/dL — ABNORMAL LOW (ref >=50)
LDL Cholesterol (Calc): 126 mg/dL (calc) — ABNORMAL HIGH
Non-HDL Cholesterol (Calc): 151 mg/dL (calc) — ABNORMAL HIGH (ref <130)
Total CHOL/HDL Ratio: 4.1 (calc) (ref <5.0)
Triglycerides: 139 mg/dL (ref <150)

## 2020-02-23 MED ORDER — ESCITALOPRAM OXALATE 10 MG PO TABS: 10.0000 mg | ORAL_TABLET | Freq: Every day | ORAL | 3 refills | Status: AC

## 2020-02-23 MED ORDER — KELNOR 1/50 1-50 MG-MCG PO TABS: 1.0000 | ORAL_TABLET | Freq: Every day | ORAL | 4 refills | Status: AC

## 2020-02-23 MED ORDER — ICOSAPENT ETHYL 1 G PO CAPS: 2.0000 g | ORAL_CAPSULE | Freq: Two times a day (BID) | ORAL | 3 refills | Status: AC

## 2020-02-23 MED ORDER — BUPROPION HCL ER (SR) 100 MG PO TB12: 100.0000 mg | ORAL_TABLET | Freq: Every day | ORAL | 3 refills | Status: AC

## 2020-02-23 MED ORDER — ATORVASTATIN CALCIUM 40 MG PO TABS: 40.0000 mg | ORAL_TABLET | Freq: Every day | ORAL | 3 refills | Status: DC

## 2020-02-23 NOTE — Patient Instructions (Signed)
General Preventive Care  Most recent routine screening labs: ordered.   Everyone should have blood pressure checked once per year. Goal 130/80 or less.   Tobacco: don't!   Alcohol: responsible moderation is ok for most adults - if you have concerns about your alcohol intake, please talk to me!   Exercise: as tolerated to reduce risk of cardiovascular disease and diabetes. Strength training will also prevent osteoporosis.   Mental health: if need for mental health care (medicines, counseling, other), or concerns about moods, please let me know!   Sexual health: if need for STD testing, or if concerns with libido/pain problems, please let me know!   Advanced Directive: Living Will and/or Healthcare Power of Attorney recommended for all adults, regardless of age or health.  Vaccines  Flu vaccine: for almost everyone, every fall.   Shingles vaccine: after age 79.   Pneumonia vaccines: after age 58  Tetanus booster: every 10 years / 3rd trimester of pregnancy - due 2028  COVID vaccine: STRONGLY RECOMMENDED!  Cancer screenings   Colon cancer screening: for everyone age 22-75. Colonoscopy available for all, many people also qualify for the Cologuard stool test   Breast cancer screening: mammogram at age 64 every other year at least, and annually after age 25.   Cervical cancer screening: Pap due 12/2023, done every 1 to 5 years depending on age and other risk factors. Can usually stop at age 11 or w/ hysterectomy.   Lung cancer screening: not needed for non-smokers . Infection screenings  . HIV: recommended screening at least once age 36-65, more often as needed. . Gonorrhea/Chlamydia: screening as needed. . Hepatitis C: recommended once for everyone age 87-75 . TB: certain at-risk populations, or depending on work requirements and/or travel history Other . Bone Density Test: recommended for women at age 5

## 2020-02-23 NOTE — Progress Notes (Signed)
HPI: Virginia Martinez is a 39 y.o. female who  has a past medical history of Anxiety, Cervical low risk human papillomavirus (HPV) DNA test positive (05/08/2018), Depression, Hyperlipidemia, Hypertriglyceridemia (04/29/2018), and Pancreatitis (2002).  she presents to [facility] today, 02/23/20,  for chief complaint of: Establish care (transfer PCP - previous PA left the practice) Concern for hiatal hernia     Patient here for annual physical / wellness exam.  See preventive care reviewed as below.  Recent labs reviewed in detail with the patient.   Additional concerns today include:  Hiatal hernia concern - feels GERD symptoms, migrating pain in upper abdomen, hx pancreatitis d/t high TG but this feels different, ongoing for months intermittently, getting worse/more frequent      Past medical, surgical, social and family history reviewed:  Patient Active Problem List   Diagnosis Date Noted  . Iron deficiency anemia due to chronic blood loss 01/20/2019  . Abnormal weight gain 01/20/2019  . Uterus, adenomyosis 12/06/2018  . Cervical low risk human papillomavirus (HPV) DNA test positive 05/08/2018  . Adult BMI 29.0-29.9 kg/sq m 04/30/2018  . Mixed hypercholesterolemia and hypertriglyceridemia 04/30/2018  . Abnormal heart rhythm 04/29/2018  . Anxiety 04/29/2018  . Encounter for medication monitoring 04/29/2018  . Mixed anxiety and depressive disorder 04/29/2018  . Menorrhagia with regular cycle 04/29/2018    Past Surgical History:  Procedure Laterality Date  . CESAREAN SECTION    . CHOLECYSTECTOMY    . TONSILLECTOMY    . TUBAL LIGATION      Social History   Tobacco Use  . Smoking status: Never Smoker  . Smokeless tobacco: Never Used  Substance Use Topics  . Alcohol use: Not Currently    Family History  Problem Relation Age of Onset  . Hypertension Mother   . Breast cancer Mother   . Skin cancer Mother   . Brain cancer Father    . Prostate cancer Father      Current medication list and allergy/intolerance information reviewed:    Current Outpatient Medications  Medication Sig Dispense Refill  . atorvastatin (LIPITOR) 40 MG tablet Take 1 tablet (40 mg total) by mouth at bedtime. 90 tablet 3  . buPROPion (WELLBUTRIN SR) 100 MG 12 hr tablet Take 1 tablet (100 mg total) by mouth daily. 90 tablet 3  . escitalopram (LEXAPRO) 10 MG tablet Take 1 tablet (10 mg total) by mouth at bedtime. 90 tablet 3  . icosapent Ethyl (VASCEPA) 1 g capsule Take 2 capsules (2 g total) by mouth 2 (two) times daily with a meal. 360 capsule 3  . KELNOR 1/50 1-50 MG-MCG tablet Take 1 tablet by mouth daily. 3 Package 4  . Lactobacillus (PROBIOTIC ACIDOPHILUS PO) Take by mouth.     No current facility-administered medications for this visit.    Allergies  Allergen Reactions  . Azithromycin Hives    Rash      Review of Systems:  Constitutional:  No  fever, no chills, No recent illness  HEENT: No  headache, no vision change  Cardiac: No  chest pain, No  pressure, No palpitations, No  Orthopnea  Respiratory:  No  shortness of breath. No  Cough  Gastrointestinal: No  abdominal pain, No  nausea  Musculoskeletal: No new myalgia/arthralgia  Skin: No  Rash, No other wounds/concerning lesions  Genitourinary: No  incontinence  Hem/Onc: No  easy bruising/bleeding, No  abnormal lymph node  Endocrine: No cold intolerance,  No heat intolerance.  Neurologic: No  weakness, No  dizziness  Psychiatric: No  concerns with depression, No  concerns with anxiety, No sleep problems, No mood problems  Exam:  BP 107/72 (BP Location: Left Arm, Patient Position: Sitting, Cuff Size: Normal)   Pulse 61   Temp 98.3 F (36.8 C) (Oral)   Wt 148 lb 1.3 oz (67.2 kg)   BMI 29.91 kg/m   Constitutional: VS see above. General Appearance: alert, well-developed, well-nourished, NAD  Eyes: Normal lids and conjunctive, non-icteric sclera  Ears,  Nose, Mouth, Throat: . TM normal bilaterally.  Neck: No masses, trachea midline. No thyroid enlargement. No tenderness/mass appreciated. No lymphadenopathy  Respiratory: Normal respiratory effort. no wheeze, no rhonchi, no rales  Cardiovascular: S1/S2 normal, no murmur, no rub/gallop auscultated. RRR. No lower extremity edema  Gastrointestinal: (+)TTP epigastrum, no rebound/guarding,, no masses. No hepatomegaly, no splenomegaly. No hernia appreciated. Bowel sounds normal. Rectal exam deferred.   Musculoskeletal: Gait normal. No clubbing/cyanosis of digits.   Neurological: Normal balance/coordination. No tremor. No cranial nerve deficit on limited exam. Motor and sensation intact and symmetric. Cerebellar reflexes intact.   Skin: warm, dry, intact. No rash/ulcer.   Psychiatric: Normal judgment/insight. Normal mood and affect. Oriented x3.    No results found for this or any previous visit (from the past 72 hour(s)).  No results found.      ASSESSMENT/PLAN: The primary encounter diagnosis was Annual physical exam. Diagnoses of Mixed hypercholesterolemia and hypertriglyceridemia, Mixed anxiety and depressive disorder, Menorrhagia with regular cycle, Breast cancer screening by mammogram, Gastroesophageal reflux disease, unspecified whether esophagitis present, and Epigastric abdominal pain were also pertinent to this visit.   Orders Placed This Encounter  Procedures  . MM 3D SCREEN BREAST BILATERAL  . CBC  . COMPLETE METABOLIC PANEL WITH GFR  . Lipid panel  . Ambulatory referral to Gastroenterology    Meds ordered this encounter  Medications  . atorvastatin (LIPITOR) 40 MG tablet    Sig: Take 1 tablet (40 mg total) by mouth at bedtime.    Dispense:  90 tablet    Refill:  3  . buPROPion (WELLBUTRIN SR) 100 MG 12 hr tablet    Sig: Take 1 tablet (100 mg total) by mouth daily.    Dispense:  90 tablet    Refill:  3  . escitalopram (LEXAPRO) 10 MG tablet    Sig: Take 1 tablet  (10 mg total) by mouth at bedtime.    Dispense:  90 tablet    Refill:  3  . icosapent Ethyl (VASCEPA) 1 g capsule    Sig: Take 2 capsules (2 g total) by mouth 2 (two) times daily with a meal.    Dispense:  360 capsule    Refill:  3  . KELNOR 1/50 1-50 MG-MCG tablet    Sig: Take 1 tablet by mouth daily.    Dispense:  3 Package    Refill:  4    Please dispense 3 month supply     General Preventive Care  Most recent routine screening labs: ordered.   Everyone should have blood pressure checked once per year. Goal 130/80 or less.   Tobacco: don't!   Alcohol: responsible moderation is ok for most adults - if you have concerns about your alcohol intake, please talk to me!   Exercise: as tolerated to reduce risk of cardiovascular disease and diabetes. Strength training will also prevent osteoporosis.   Mental health: if need for mental health care (medicines, counseling, other), or concerns about moods, please let  me know!   Sexual health: if need for STD testing, or if concerns with libido/pain problems, please let me know!   Advanced Directive: Living Will and/or Healthcare Power of Attorney recommended for all adults, regardless of age or health.  Vaccines  Flu vaccine: for almost everyone, every fall.   Shingles vaccine: after age 62.   Pneumonia vaccines: after age 70  Tetanus booster: every 10 years / 3rd trimester of pregnancy - due 2028  COVID vaccine: STRONGLY RECOMMENDED!  Cancer screenings   Colon cancer screening: for everyone age 61-75. Colonoscopy available for all, many people also qualify for the Cologuard stool test   Breast cancer screening: mammogram at age 18 every other year at least, and annually after age 20.   Cervical cancer screening: Pap due 12/2023, done every 1 to 5 years depending on age and other risk factors. Can usually stop at age 67 or w/ hysterectomy.   Lung cancer screening: not needed for non-smokers . Infection screenings  . HIV:  recommended screening at least once age 71-65, more often as needed. . Gonorrhea/Chlamydia: screening as needed. . Hepatitis C: recommended once for everyone age 25-75 . TB: certain at-risk populations, or depending on work requirements and/or travel history Other . Bone Density Test: recommended for women at age 59          Visit summary with medication list and pertinent instructions was printed for patient to review. All questions at time of visit were answered - patient instructed to contact office with any additional concerns or updates. ER/RTC precautions were reviewed with the patient.    Please note: voice recognition software was used to produce this document, and typos may escape review. Please contact Dr. Sheppard Coil for any needed clarifications.     Follow-up plan: Return in about 1 year (around 02/22/2021) for [place] (call week prior to visit for lab orders).

## 2020-02-24 ENCOUNTER — Ambulatory Visit
Admission: RE | Admit: 2020-02-24 | Discharge: 2020-02-24 | Disposition: A | Discharge status: Home / Self Care | Payer: No Typology Code available for payment source | Source: Ambulatory Visit | Attending: Osteopathic Medicine | Admitting: Osteopathic Medicine

## 2020-02-24 DIAGNOSIS — Z1231 Encounter for screening mammogram for malignant neoplasm of breast: Secondary | ICD-10-CM | POA: Insufficient documentation

## 2020-02-24 DIAGNOSIS — Z0000 Encounter for general adult medical examination without abnormal findings: Secondary | ICD-10-CM | POA: Diagnosis not present

## 2020-02-25 ENCOUNTER — Inpatient Hospital Stay
Admission: RE | Admit: 2020-02-25 | Discharge: 2020-02-25 | Disposition: A | Discharge status: Home / Self Care | Payer: Self-pay | Source: Ambulatory Visit | Attending: *Deleted | Admitting: *Deleted

## 2020-02-25 ENCOUNTER — Other Ambulatory Visit: Payer: Self-pay | Admitting: *Deleted

## 2020-02-25 DIAGNOSIS — Z1231 Encounter for screening mammogram for malignant neoplasm of breast: Secondary | ICD-10-CM

## 2020-03-19 ENCOUNTER — Other Ambulatory Visit: Payer: Self-pay

## 2020-03-19 ENCOUNTER — Encounter: Payer: Self-pay | Admitting: Gastroenterology

## 2020-03-19 ENCOUNTER — Telehealth: Payer: Self-pay

## 2020-03-19 ENCOUNTER — Ambulatory Visit: Payer: No Typology Code available for payment source | Admitting: Gastroenterology

## 2020-03-19 VITALS — BP 102/64 | HR 74 | Temp 97.1°F | Ht 59.0 in | Wt 151.2 lb

## 2020-03-19 DIAGNOSIS — R1013 Epigastric pain: Secondary | ICD-10-CM

## 2020-03-19 DIAGNOSIS — R945 Abnormal results of liver function studies: Secondary | ICD-10-CM

## 2020-03-19 DIAGNOSIS — R7989 Other specified abnormal findings of blood chemistry: Secondary | ICD-10-CM

## 2020-03-19 MED ORDER — PANTOPRAZOLE SODIUM 40 MG PO TBEC
40.0000 mg | DELAYED_RELEASE_TABLET | Freq: Every day | ORAL | 3 refills | Status: DC
Start: 2020-03-19 — End: 2020-07-28

## 2020-03-19 NOTE — Telephone Encounter (Signed)
Called pt to confirm if she had either a COVID test or completed COVID vaccination required for upcoming endoscopy.  No answer.  LMTCB.

## 2020-03-19 NOTE — Progress Notes (Signed)
Chief Complaint: abdo pain  Referring Provider:  Emeterio Reeve, DO      ASSESSMENT AND PLAN;   #1. Epi pain. S/P lap chole. Remote H/O TG pancreatitis. Most recent TG levels were normal.  She is compliant with medications.  #2. Abn LFTs (elevated ALT), likely d/t fatty liver. R/O other causes.  #3. IBS-alt constipation/diarrhea.  Will observe for now.  Plan: -Protonix 40mg  po qd, 1/2hr before breakfast (#30) -EGD for further evaluation.  Discussed risks and benefits. -[place] Abdo -Discussed treatment for fatty liver in detail.  I recommended wt loss (aim is to reduce 6lb over 12 weeks).  She is highly motivated.  I encouraged her to start exercising and watching caloric intake for now. -If still with problems and the above WU is negative, proceed with CT scan abdo/pelvis. -Continue rest of the medications. -FU in 12 weeks.  At FU, recheck LFTs.  If abnormal, will proceed with further liver work-up.    HPI:    Virginia Martinez is a 38 y.o. 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  She also had more heartburn.  Worse after eating.  Associated nausea but no vomiting.  She does complain of some abdominal bloating.  Very tender to touch.  Denies having any nonsteroidals.  Also having diarrhea x 3 days which is resolved now.  Has previous history of constipation with pellet-like stools, abdominal bloating and has been diagnosed as having IBS previously.  No melena or hematochezia.  No fever chills or night sweats.  No jaundice dark urine or pale stools.  No weight loss.  In fact she has gained 20 pounds over the last 1 year.  Has H/O TG pancreatitis 2002/2003 (just after her first child).  Last TG 02/23/2020 was 139.  She is compliant with medications Vascepa and Lipitor.  Has not been on Lopid. S/P chole (d/t GB sludge, no stones, not related to  pancreatitis) "abdo migraine" -was treated with amitriptyline previously with good results, over 10 years ago.  And undergone EGD 04/2015: neg with neg SB Bx (report sent for scanning)  Never had colonoscopy.  No history suggestive of fatty food intolerance, lactose intolerance or gluten intolerance.   No sodas, chocolates, chewing gums, artificial sweeteners and candy. No NSAIDs   Past Medical History:  Diagnosis Date  . Anxiety   . Cervical low risk human papillomavirus (HPV) DNA test positive 05/08/2018  . Depression   . Hyperlipidemia   . Hypertriglyceridemia 04/29/2018  . Pancreatitis 2002    Past Surgical History:  Procedure Laterality Date  . CESAREAN SECTION    . CHOLECYSTECTOMY    . ESOPHAGOGASTRODUODENOSCOPY  05/21/2015   [facility]  . TONSILLECTOMY    . TUBAL LIGATION      Family History  Problem Relation Age of Onset  . Hypertension Mother   . Skin cancer Mother   . Brain cancer Father   . Prostate cancer Father     Social History   Tobacco Use  . Smoking status: Never Smoker  . Smokeless tobacco: Never Used  Substance Use Topics  . Alcohol use: Not Currently  . Drug use: Never    Current Outpatient Medications  Medication Sig Dispense Refill  . atorvastatin (LIPITOR) 40 MG tablet Take 1 tablet (40 mg total) by mouth at bedtime. 90 tablet 3  . buPROPion [facility] SR)  100 MG 12 hr tablet Take 1 tablet (100 mg total) by mouth daily. 90 tablet 3  . escitalopram (LEXAPRO) 10 MG tablet Take 1 tablet (10 mg total) by mouth at bedtime. 90 tablet 3  . icosapent Ethyl (VASCEPA) 1 g capsule Take 2 capsules (2 g total) by mouth 2 (two) times daily with a meal. 360 capsule 3  . KELNOR 1/50 1-50 MG-MCG tablet Take 1 tablet by mouth daily. 3 Package 4  . Lactobacillus (PROBIOTIC ACIDOPHILUS PO) Take by mouth.     No current facility-administered medications for this visit.    Allergies  Allergen Reactions  . Azithromycin Hives    Rash    Review of  Systems:  Constitutional: Denies fever, chills, diaphoresis, appetite change and fatigue.  HEENT: Denies photophobia, eye pain, redness, hearing loss, ear pain, congestion, sore throat, rhinorrhea, sneezing, mouth sores, neck pain, neck stiffness and tinnitus.   Respiratory: Denies SOB, DOE, cough, chest tightness,  and wheezing.   Cardiovascular: Denies chest pain, palpitations and leg swelling.  Genitourinary: Denies dysuria, urgency, frequency, hematuria, flank pain and difficulty urinating.  Musculoskeletal: Denies myalgias, back pain, joint swelling, arthralgias and gait problem.  Skin: No rash.  Neurological: Denies dizziness, seizures, syncope, weakness, light-headedness, numbness and headaches.  Hematological: Denies adenopathy. Easy bruising, personal or family bleeding history  Psychiatric/Behavioral: Has anxiety or depression     Physical Exam:    BP 102/64   Pulse 74   Temp (!) 97.1 F (36.2 C)   Ht 4\' 11"  (1.499 m)   Wt 151 lb 3.2 oz (68.6 kg)   BMI 30.54 kg/m  Wt Readings from Last 3 Encounters:  03/19/20 151 lb 3.2 oz (68.6 kg)  02/23/20 148 lb 1.3 oz (67.2 kg)  01/20/19 134 lb (60.8 kg)   Constitutional:  Well-developed, in no acute distress. Psychiatric: Normal mood and affect. Behavior is normal. HEENT: Pupils normal.  Conjunctivae are normal. No scleral icterus. Neck supple.  Cardiovascular: Normal rate, regular rhythm. No edema Pulmonary/chest: Effort normal and breath sounds normal. No wheezing, rales or rhonchi. Abdominal: Soft, nondistended.  Epigastric tenderness bowel sounds active throughout. There are no masses palpable.  Liver is palpated 2 cm below the costal margin.. Rectal:  defered Neurological: Alert and oriented to person place and time. Skin: Skin is warm and dry. No rashes noted.  Data Reviewed: I have personally reviewed following labs and imaging studies  CBC: CBC Latest Ref Rng & Units 02/23/2020 04/29/2018  WBC 3.8 - 10.8 Thousand/uL  5.5 7.3  Hemoglobin 11.7 - 15.5 g/dL 14.1 12.8  Hematocrit 35.0 - 45.0 % 42.5 35.3  Platelets 140 - 400 Thousand/uL 234 248    CMP: CMP Latest Ref Rng & Units 02/23/2020 04/29/2018  Glucose 65 - 99 mg/dL 91 78  BUN 7 - 25 mg/dL 15 12  Creatinine 0.50 - 1.10 mg/dL 0.55 0.54  Sodium 135 - 146 mmol/L 138 134(L)  Potassium 3.5 - 5.3 mmol/L 4.4 4.3  Chloride 98 - 110 mmol/L 103 100  CO2 20 - 32 mmol/L 29 24  Calcium 8.6 - 10.2 mg/dL 9.5 9.1  Total Protein 6.1 - 8.1 g/dL 7.7 7.3  Total Bilirubin 0.2 - 1.2 mg/dL 0.8 0.3  AST 10 - 30 U/L 25 10  ALT 6 - 29 U/L 40(H) 7       Radiology Studies: MM 3D SCREEN BREAST BILATERAL  Result Date: 02/27/2020 CLINICAL DATA:  Screening. EXAM: DIGITAL SCREENING BILATERAL MAMMOGRAM WITH TOMO AND CAD COMPARISON:  Previous exam(s).  ACR Breast Density Category c: The breast tissue is heterogeneously dense, which may obscure small masses. FINDINGS: There are no findings suspicious for malignancy. Images were processed with CAD. IMPRESSION: No mammographic evidence of malignancy. A result letter of this screening mammogram will be mailed directly to the patient. RECOMMENDATION: Screening mammogram in one year. (Code:SM-B-01Y) BI-RADS CATEGORY  1: Negative. Electronically Signed   By: Audie Pinto M.D.   On: 02/27/2020 10:46   MM Outside Films Mammo  Result Date: 02/25/2020 This examination belongs to an outside facility and is stored here for comparison purposes only.  Contact the originating outside institution for any associated report or interpretation.     Carmell Austria, MD 03/19/2020, 9:01 AM  Cc: Emeterio Reeve, DO

## 2020-03-19 NOTE — Patient Instructions (Addendum)
If you are age 40 or older, your body mass index should be between 23-30. Your Body mass index is 30.54 kg/m. If this is out of the aforementioned range listed, please consider follow up with your Primary Care Provider.  If you are age 90 or younger, your body mass index should be between 19-25. Your Body mass index is 30.54 kg/m. If this is out of the aformentioned range listed, please consider follow up with your Primary Care Provider.   You have been scheduled for an endoscopy. Please follow written instructions given to you at your visit today. If you use inhalers (even only as needed), please bring them with you on the day of your procedure. Your physician has requested that you go to www.startemmi.com and enter the access code given to you at your visit today. This web site gives a general overview about your procedure. However, you should still follow specific instructions given to you by our office regarding your preparation for the procedure.  We have sent the following medications to your pharmacy for you to pick up at your convenience: Protonix 40 mg.  You have been scheduled for an abdominal ultrasound at [facility] on 03/25/20 at 8:30 am. Please arrive 15 minutes prior to your appointment for registration. Make certain not to have anything to eat or drink 6 hours prior to your appointment. Should you need to reschedule your appointment, please contact radiology at [phone]. This test typically takes about 30 minutes to perform.  Lose weight  6 pounds over the next 12 weeks.  Follow up with me in 12 weeks.  Thank you for choosing me and [facility].  Jackquline Denmark, MD

## 2020-03-22 ENCOUNTER — Encounter: Payer: Self-pay | Admitting: Gastroenterology

## 2020-03-22 ENCOUNTER — Other Ambulatory Visit: Payer: Self-pay

## 2020-03-22 ENCOUNTER — Ambulatory Visit (AMBULATORY_SURGERY_CENTER): Payer: No Typology Code available for payment source | Admitting: Gastroenterology

## 2020-03-22 VITALS — BP 121/72 | HR 80 | Temp 97.5°F | Resp 15 | Ht 59.0 in | Wt 151.0 lb

## 2020-03-22 DIAGNOSIS — K219 Gastro-esophageal reflux disease without esophagitis: Secondary | ICD-10-CM

## 2020-03-22 DIAGNOSIS — K2950 Unspecified chronic gastritis without bleeding: Secondary | ICD-10-CM

## 2020-03-22 DIAGNOSIS — R1013 Epigastric pain: Secondary | ICD-10-CM

## 2020-03-22 DIAGNOSIS — K208 Other esophagitis without bleeding: Secondary | ICD-10-CM

## 2020-03-22 MED ORDER — SODIUM CHLORIDE 0.9 % IV SOLN: 500.0000 mL | Freq: Once | INTRAVENOUS | Status: DC

## 2020-03-22 NOTE — Progress Notes (Signed)
Called to room to assist during endoscopic procedure.  Patient ID and intended procedure confirmed with present staff. Received instructions for my participation in the procedure from the performing physician.  

## 2020-03-22 NOTE — Patient Instructions (Signed)
Continue your protonix, and avoid all ibuprofen, aleve and aspirin until further notice.  Thank-you for choosing [place] for your healthcare needs today.   YOU HAD AN ENDOSCOPIC PROCEDURE TODAY AT [facility]:   Refer to the procedure report that was given to you for any specific questions about what was found during the examination.  If the procedure report does not answer your questions, please call your gastroenterologist to clarify.  If you requested that your care partner not be given the details of your procedure findings, then the procedure report has been included in a sealed envelope for you to review at your convenience later.  YOU SHOULD EXPECT: Some feelings of bloating in the abdomen. Passage of more gas than usual.  Walking can help get rid of the air that was put into your GI tract during the procedure and reduce the bloating.   Please Note:  You might notice some irritation and congestion in your nose or some drainage.  This is from the oxygen used during your procedure.  There is no need for concern and it should clear up in a day or so.  SYMPTOMS TO REPORT IMMEDIATELY:    Following upper endoscopy (EGD)  Vomiting of blood or coffee ground material  New chest pain or pain under the shoulder blades  Painful or persistently difficult swallowing  New shortness of breath  Fever of 100F or higher  Black, tarry-looking stools  For urgent or emergent issues, a gastroenterologist can be reached at any hour by calling [phone]. Do not use MyChart messaging for urgent concerns.    DIET:  We do recommend a small meal at first, but then you may proceed to your regular diet.  Drink plenty of fluids but you should avoid alcoholic beverages for 24 hours. Try to follow the GERD diet.  ACTIVITY:  You should plan to take it easy for the rest of today and you should NOT DRIVE or use heavy machinery until tomorrow (because of the sedation medicines used during the test).     FOLLOW UP: Our staff will call the number listed on your records 48-72 hours following your procedure to check on you and address any questions or concerns that you may have regarding the information given to you following your procedure. If we do not reach you, we will leave a message.  We will attempt to reach you two times.  During this call, we will ask if you have developed any symptoms of COVID 19. If you develop any symptoms (ie: fever, flu-like symptoms, shortness of breath, cough etc.) before then, please call [phone].  If you test positive for Covid 19 in the 2 weeks post procedure, please call and report this information to [place].    If any biopsies were taken you will be contacted by phone or by letter within the next 1-3 weeks.  Please call us at [phone] if you have not heard about the biopsies in 3 weeks.    SIGNATURES/CONFIDENTIALITY: You and/or your care partner have signed paperwork which will be entered into your electronic medical record.  These signatures attest to the fact that that the information above on your After Visit Summary has been reviewed and is understood.  Full responsibility of the confidentiality of this discharge information lies with you and/or your care-partner.

## 2020-03-22 NOTE — Op Note (Signed)
[place] Patient Name: Virginia Martinez Procedure Date: 03/22/2020 7:22 AM MRN: [id number] Endoscopist: Jackquline Denmark , MD Age: 39 Referring MD:  Date of Birth: Jan 25, 1981 Gender: Female Account #: [id number] Procedure:                Upper GI endoscopy Indications:              Epigastric abdominal pain Medicines:                Monitored Anesthesia Care Procedure:                Pre-Anesthesia Assessment:                           - Prior to the procedure, a History and Physical                            was performed, and patient medications and                            allergies were reviewed. The patient's tolerance of                            previous anesthesia was also reviewed. The risks                            and benefits of the procedure and the sedation                            options and risks were discussed with the patient.                            All questions were answered, and informed consent                            was obtained. Prior Anticoagulants: The patient has                            taken no previous anticoagulant or antiplatelet                            agents. ASA Grade Assessment: II - A patient with                            mild systemic disease. After reviewing the risks                            and benefits, the patient was deemed in                            satisfactory condition to undergo the procedure.                           After obtaining informed consent, the endoscope was  passed under direct vision. Throughout the                            procedure, the patient's blood pressure, pulse, and                            oxygen saturations were monitored continuously. The                            Endoscope was introduced through the mouth, and                            advanced to the second part of duodenum. The upper                            GI endoscopy was  accomplished without difficulty.                            The patient tolerated the procedure well. Scope In: Scope Out: Findings:                 The Z-line was irregular with healed distal                            esophageal erosions, 33 cm from the incisors.                            Biopsies were taken with a cold forceps for                            histology directed by NBI.                           Localized mild inflammation characterized by                            erythema was found in the gastric antrum. Biopsies                            were taken with a cold forceps for histology.                            Retroflexed examination of the cardia was normal GE                            junction flap as [facility] Gd I. No hiatal hernias were                            noted.                           The examined duodenum was normal. Biopsies for                            histology  were taken with a cold forceps for                            evaluation of celiac disease. Complications:            No immediate complications. Estimated Blood Loss:     Estimated blood loss: none. Impression:               -Distal healed esophageal erosions.                           -Mild gastritis. Recommendation:           - Patient has a contact number available for                            emergencies. The signs and symptoms of potential                            delayed complications were discussed with the                            patient. Return to normal activities tomorrow.                            Written discharge instructions were provided to the                            patient.                           - Resume previous diet.                           - Continue Protonix 40 mg p.o. once a day                           - Await pathology results.                           - Avoid ibuprofen, naproxen, or other non-steroidal                             anti-inflammatory drugs.                           - The findings and recommendations were discussed                            with the patient's husband Thurmond Butts.                           - Return to GI clinic in 8 weeks. Jackquline Denmark, MD 03/22/2020 8:18:51 AM This report has been signed electronically.

## 2020-03-22 NOTE — Progress Notes (Signed)
Report to PACU, RN, vss, BBS= Clear.  

## 2020-03-22 NOTE — Progress Notes (Signed)
VS taken by DT 

## 2020-03-24 ENCOUNTER — Encounter: Payer: Self-pay | Admitting: Gastroenterology

## 2020-03-24 ENCOUNTER — Telehealth: Payer: Self-pay

## 2020-03-24 ENCOUNTER — Ambulatory Visit: Payer: No Typology Code available for payment source

## 2020-03-24 NOTE — Telephone Encounter (Signed)
First post procedure follow up call, no answer 

## 2020-03-24 NOTE — Telephone Encounter (Signed)
No answer on 2nd follow up call.

## 2020-03-25 ENCOUNTER — Other Ambulatory Visit: Payer: Self-pay

## 2020-03-25 ENCOUNTER — Ambulatory Visit
Admission: RE | Admit: 2020-03-25 | Discharge: 2020-03-25 | Disposition: A | Discharge status: Home / Self Care | Payer: No Typology Code available for payment source | Source: Ambulatory Visit | Attending: Gastroenterology | Admitting: Gastroenterology

## 2020-03-25 DIAGNOSIS — R1013 Epigastric pain: Secondary | ICD-10-CM | POA: Insufficient documentation

## 2020-03-25 DIAGNOSIS — R945 Abnormal results of liver function studies: Secondary | ICD-10-CM | POA: Diagnosis present

## 2020-03-25 DIAGNOSIS — R7989 Other specified abnormal findings of blood chemistry: Secondary | ICD-10-CM

## 2020-04-29 ENCOUNTER — Ambulatory Visit: Payer: No Typology Code available for payment source | Admitting: Gastroenterology

## 2020-06-01 ENCOUNTER — Ambulatory Visit: Payer: No Typology Code available for payment source | Admitting: Gastroenterology

## 2020-07-28 ENCOUNTER — Other Ambulatory Visit: Payer: Self-pay | Admitting: Gastroenterology

## 2020-07-28 ENCOUNTER — Encounter: Payer: Self-pay | Admitting: Osteopathic Medicine

## 2020-07-28 DIAGNOSIS — D485 Neoplasm of uncertain behavior of skin: Secondary | ICD-10-CM

## 2020-07-28 DIAGNOSIS — Z808 Family history of malignant neoplasm of other organs or systems: Secondary | ICD-10-CM

## 2020-07-29 NOTE — Telephone Encounter (Signed)
Routing to covering provider. Referral pended.

## 2020-08-03 ENCOUNTER — Telehealth: Payer: Self-pay | Admitting: Dermatology

## 2020-08-03 NOTE — Telephone Encounter (Signed)
Referral, told her you'd contact referrer (sorry, forgot to ask who it was), and request that they try to get her in before March elsewhere

## 2020-08-11 ENCOUNTER — Encounter: Payer: Self-pay | Admitting: Osteopathic Medicine

## 2020-08-11 ENCOUNTER — Ambulatory Visit (INDEPENDENT_AMBULATORY_CARE_PROVIDER_SITE_OTHER): Payer: No Typology Code available for payment source | Admitting: Osteopathic Medicine

## 2020-08-11 ENCOUNTER — Other Ambulatory Visit: Payer: Self-pay

## 2020-08-11 VITALS — BP 109/76 | HR 70 | Wt 148.0 lb

## 2020-08-11 DIAGNOSIS — L309 Dermatitis, unspecified: Secondary | ICD-10-CM

## 2020-08-11 MED ORDER — FLUOCINOLONE ACETONIDE 0.01 % EX CREA: TOPICAL_CREAM | Freq: Three times a day (TID) | CUTANEOUS | 1 refills | Status: AC

## 2020-08-11 NOTE — Progress Notes (Signed)
Virginia Martinez is a 39 y.o. female who presents to  [place] at [facility]  today, 08/11/20, seeking care for the following:  . Eczema on face - hasn't tried OTC hydrocortisone because was told awhile back at an ER visit that that was bad for the face. Was given something at that visit non-steroidal but can't recall name of Rx. Currently having mild eczema at temples and eyelid on L eye, some on scalp.      ASSESSMENT & PLAN with other pertinent findings:  The encounter diagnosis was Eczema, unspecified type.   Reviewed lotion/emollient use daily, mild / low potent steroids ok (hydrocortisone) for mild and can increase potency as needed (Rx written for fluocinolone) for short term use under 2 weeks, can lighten/thin the skin     Meds ordered this encounter  Medications  . fluocinolone (VANOS) 0.01 % cream    Sig: Apply topically 3 (three) times daily. To affected area(s) as needed    Dispense:  60 g    Refill:  1       Follow-up instructions: Return if symptoms worsen or fail to improve.                                         BP 109/76 (BP Location: Left Arm, Patient Position: Sitting)   Pulse 70   Wt 148 lb (67.1 kg)   SpO2 98%   BMI 29.89 kg/m   Current Meds  Medication Sig  . atorvastatin (LIPITOR) 40 MG tablet Take 1 tablet (40 mg total) by mouth at bedtime.  Marland Kitchen buPROPion (WELLBUTRIN SR) 100 MG 12 hr tablet Take 1 tablet (100 mg total) by mouth daily.  Marland Kitchen escitalopram (LEXAPRO) 10 MG tablet Take 1 tablet (10 mg total) by mouth at bedtime.  . Lactobacillus (PROBIOTIC ACIDOPHILUS PO) Take by mouth.  . pantoprazole (PROTONIX) 40 MG tablet TAKE 1 TABLET (40 MG TOTAL) BY MOUTH DAILY. TAKE 30 MINUTES BEFORE BREAKFAST.    No results found for this or any previous visit (from the past 72 hour(s)).  No results found.     All questions at time of visit were answered - patient  instructed to contact office with any additional concerns or updates.  ER/RTC precautions were reviewed with the patient as applicable.   Please note: voice recognition software was used to produce this document, and typos may escape review. Please contact Dr. Sheppard Coil for any needed clarifications.

## 2020-08-11 NOTE — Patient Instructions (Addendum)

## 2020-11-14 IMAGING — US US ABDOMEN COMPLETE
1 series · 14 of 25 positions shown · non-contrast
Comparison: None.

CLINICAL DATA: Epigastric region pain with elevated liver enzymes

EXAM:
ABDOMEN ULTRASOUND COMPLETE

[Series 1: us abdomen complete · 14 of 125 slices shown]
[im 1/125]
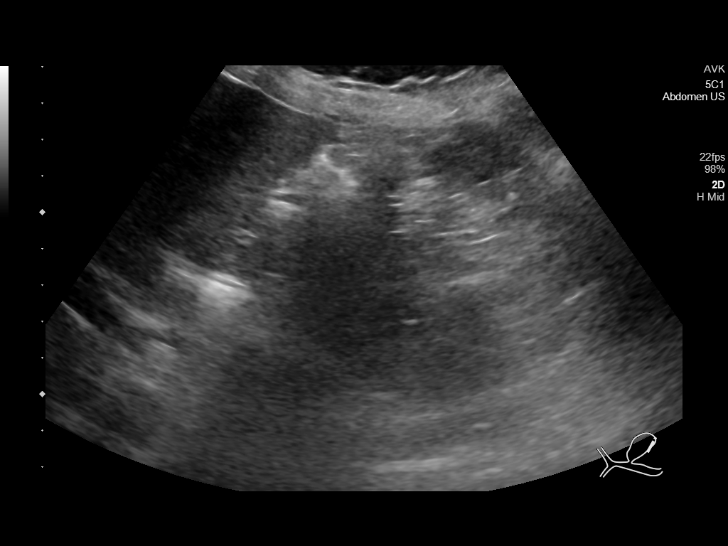
[im 11/125]
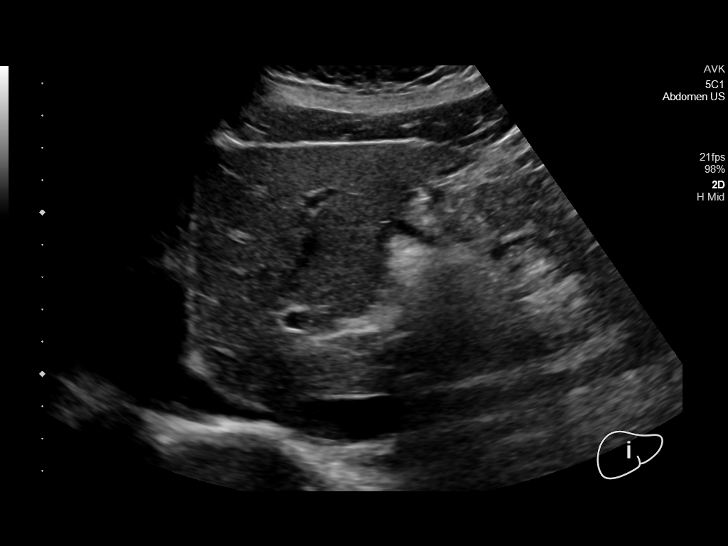
[im 21/125]
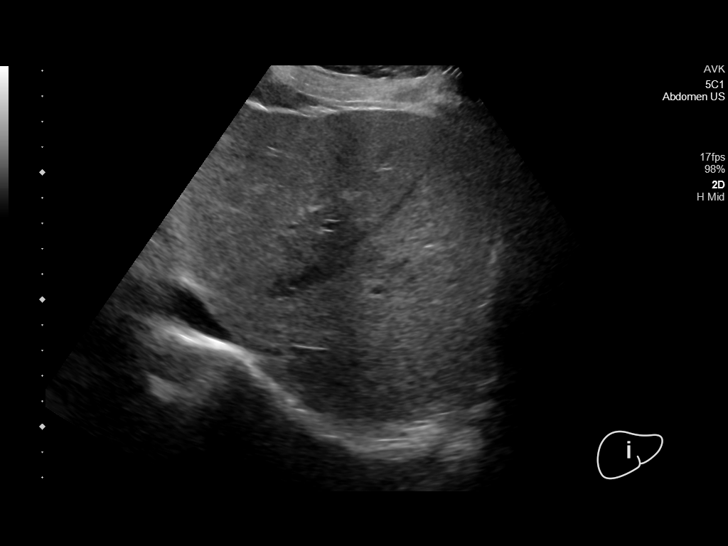
[im 32/125]
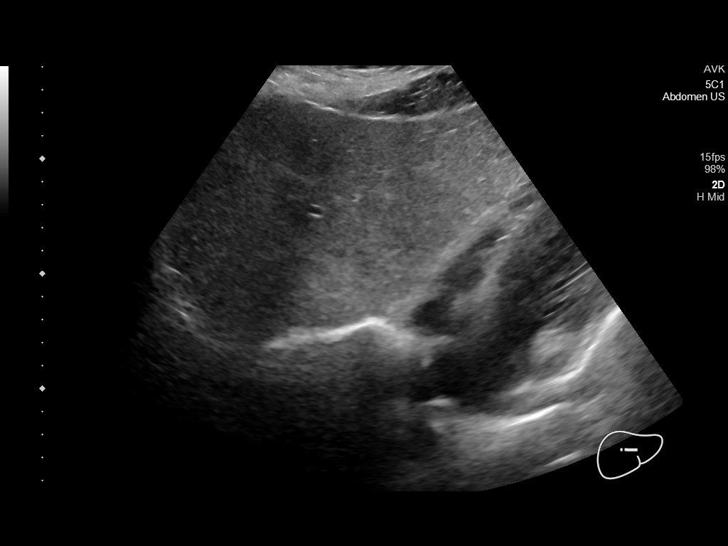
[im 42/125]
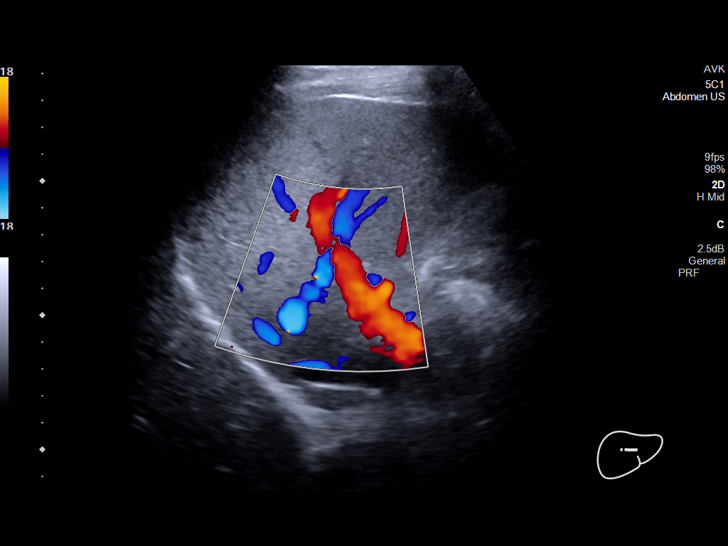
[im 47/125]
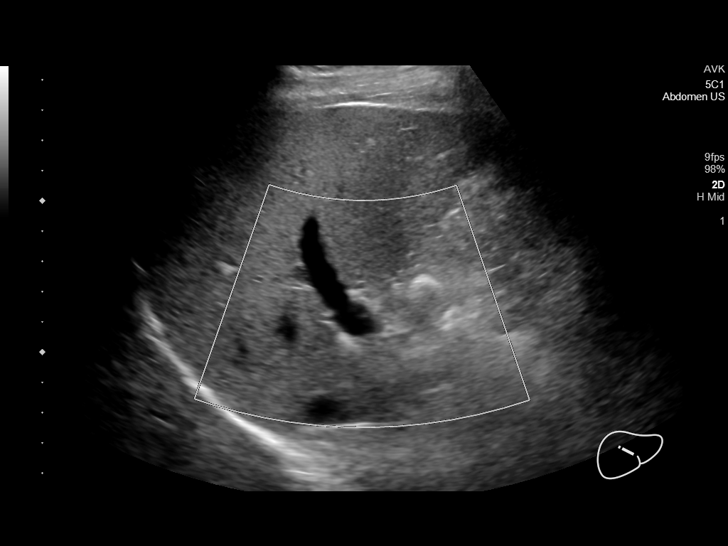
[im 57/125]
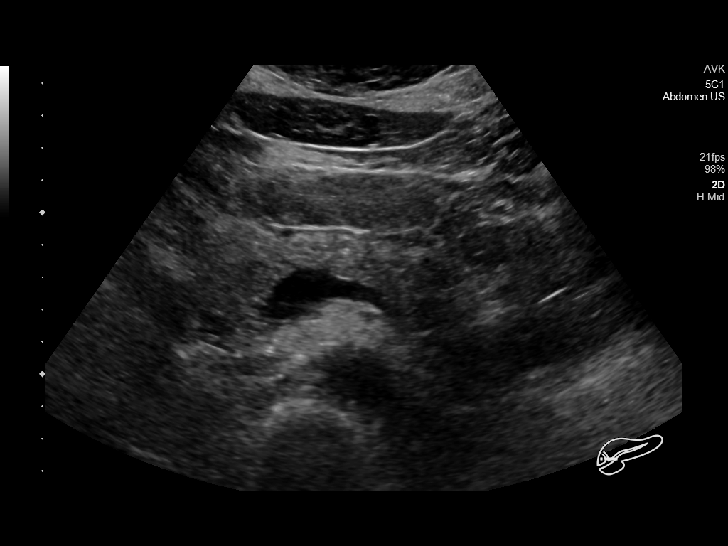
[im 68/125]
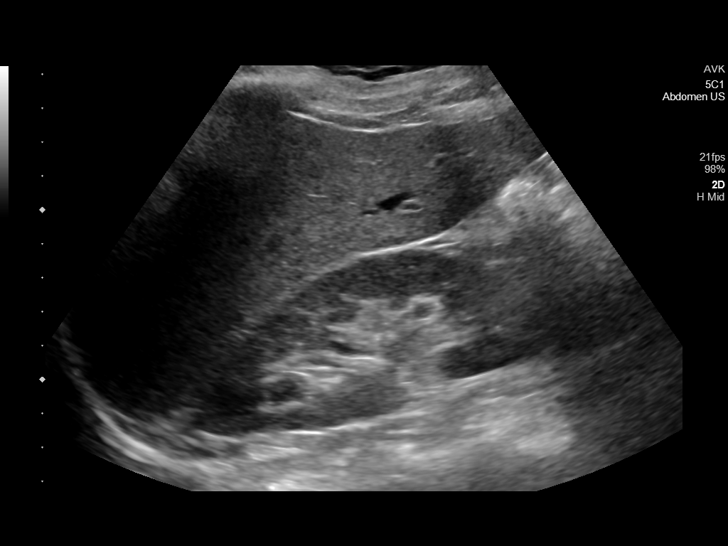
[im 78/125]
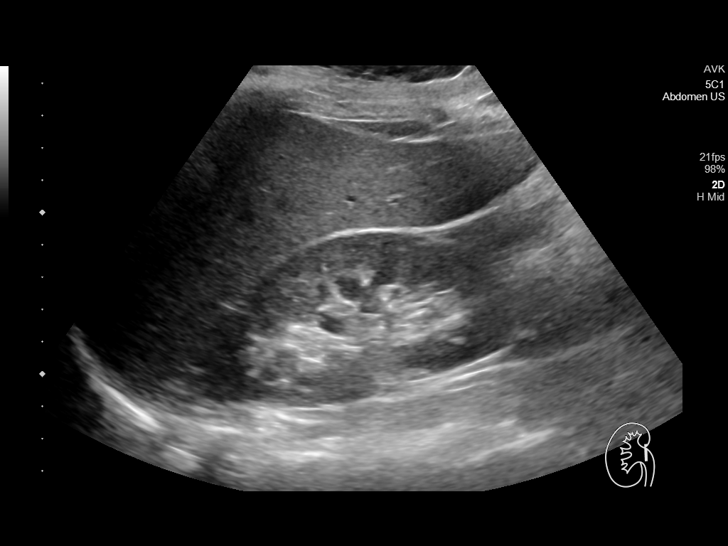
[im 83/125]
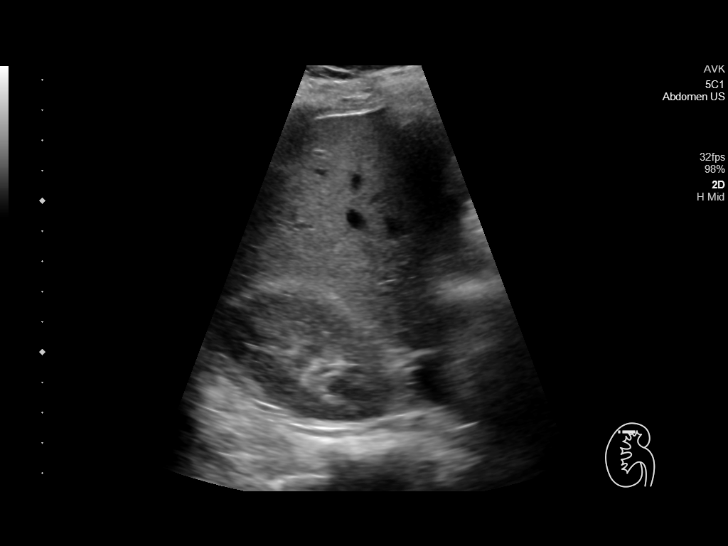
[im 94/125]
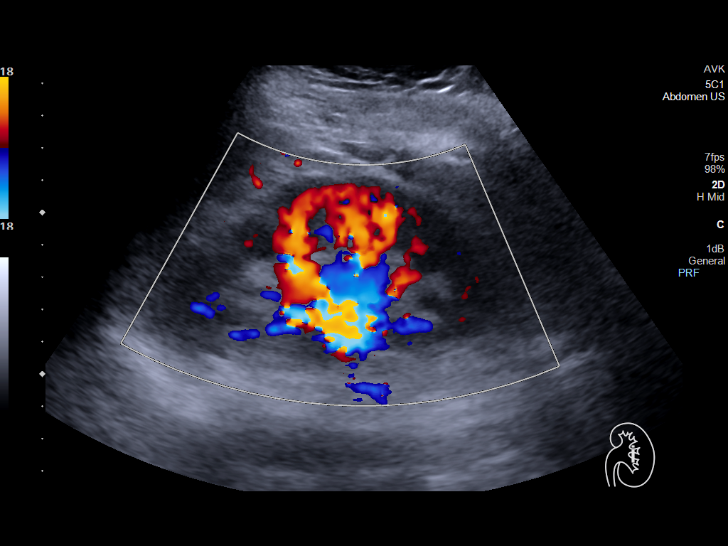
[im 104/125]
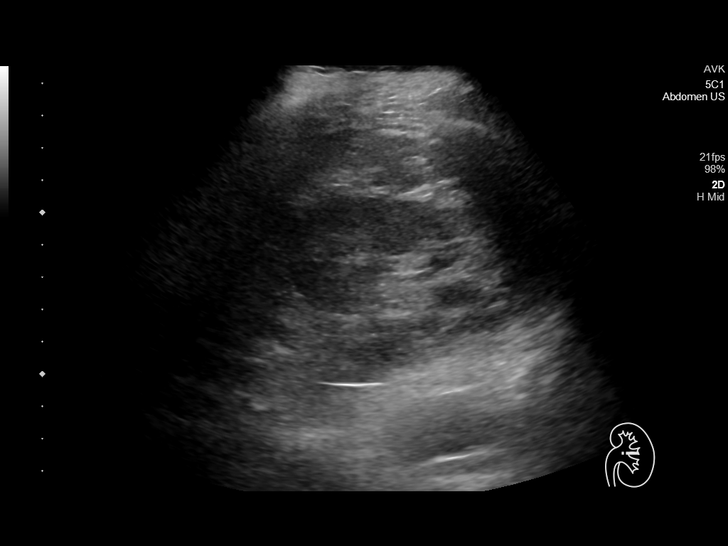
[im 114/125]
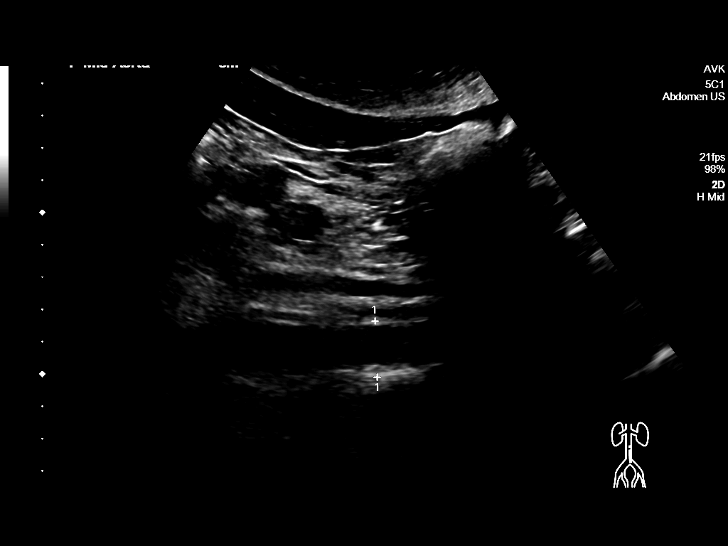
[im 125/125]
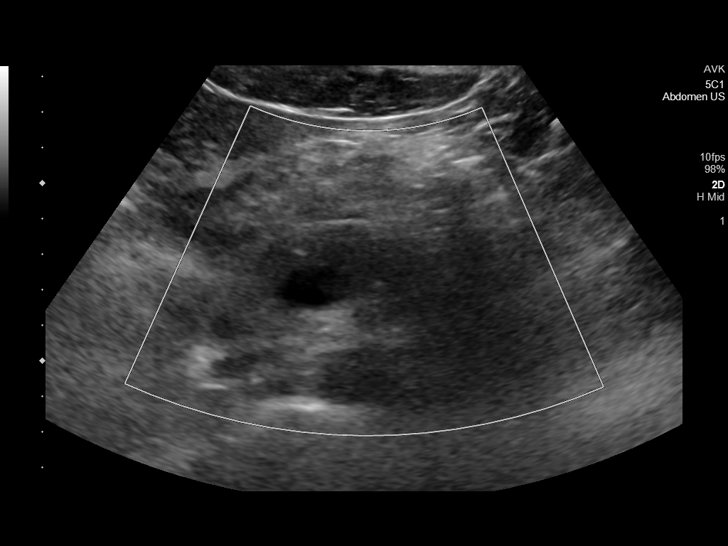

[14 of 25 positions shown; findings below may reference images not displayed]

FINDINGS: Gallbladder: Surgically absent.

Common bile duct: Diameter: 4 mm. No intrahepatic, common hepatic,
or common bile duct dilatation.

Liver: No focal lesion identified. Within normal limits in
parenchymal echogenicity. Portal vein is patent on color Doppler
imaging with normal direction of blood flow towards the liver.

IVC: No abnormality visualized.

Pancreas: No pancreatic mass or inflammatory focus.

Spleen: Size and appearance within normal limits.

Right Kidney: Length: 10.4 cm. Echogenicity within normal limits. No
mass or hydronephrosis visualized.

Left Kidney: Length: 10.6 cm. Echogenicity within normal limits. No
mass or hydronephrosis visualized.

Abdominal aorta: No aneurysm visualized.

Other findings: No demonstrable ascites.
IMPRESSION: Gallbladder absent.  Study otherwise unremarkable.

## 2020-12-21 ENCOUNTER — Other Ambulatory Visit: Payer: Self-pay | Admitting: Gastroenterology

## 2021-03-19 ENCOUNTER — Other Ambulatory Visit: Payer: Self-pay | Admitting: Osteopathic Medicine

## 2021-03-19 DIAGNOSIS — E782 Mixed hyperlipidemia: Secondary | ICD-10-CM

## 2021-08-01 ENCOUNTER — Telehealth: Payer: Self-pay | Admitting: General Practice

## 2021-08-01 NOTE — Telephone Encounter (Signed)
Transition Care Management Unsuccessful Follow-up Telephone Call  Date of discharge and from where:  07/30/21 from [facility]  Attempts:  1st Attempt  Reason for unsuccessful TCM follow-up call:  Left voice message

## 2021-08-04 NOTE — Telephone Encounter (Signed)
Transition Care Management Follow-up Telephone Call Date of discharge and from where: 07/30/2021 from [facility] How have you been since you were released from the hospital? Pt reached and stated that she is doing well. Pt stated that she is working for [employer] and has changed PCP.  Any questions or concerns? No
